# Patient Record
Sex: Male | Born: 1975 | Race: White | Hispanic: No | Smoking: Current every day smoker
Health system: Southern US, Community
[De-identification: ages and names within clinical notes are randomized; demographics above are authoritative.]

## PROBLEM LIST (undated history)

## (undated) DIAGNOSIS — Z720 Tobacco use: Secondary | ICD-10-CM

## (undated) DIAGNOSIS — J45909 Unspecified asthma, uncomplicated: Secondary | ICD-10-CM

## (undated) HISTORY — PX: TONSILLECTOMY: SUR1361

---

## 1998-07-19 ENCOUNTER — Emergency Department (HOSPITAL_COMMUNITY): Admission: EM | Admit: 1998-07-19 | Discharge: 1998-07-19 | Payer: Self-pay | Admitting: Emergency Medicine

## 1999-04-06 ENCOUNTER — Emergency Department (HOSPITAL_COMMUNITY): Admission: EM | Admit: 1999-04-06 | Discharge: 1999-04-07 | Payer: Self-pay | Admitting: Emergency Medicine

## 1999-04-23 ENCOUNTER — Emergency Department (HOSPITAL_COMMUNITY): Admission: EM | Admit: 1999-04-23 | Discharge: 1999-04-23 | Payer: Self-pay | Admitting: Emergency Medicine

## 1999-04-23 ENCOUNTER — Encounter: Payer: Self-pay | Admitting: Emergency Medicine

## 1999-05-02 ENCOUNTER — Emergency Department (HOSPITAL_COMMUNITY): Admission: EM | Admit: 1999-05-02 | Discharge: 1999-05-02 | Payer: Self-pay | Admitting: Emergency Medicine

## 1999-05-08 ENCOUNTER — Encounter: Admission: RE | Admit: 1999-05-08 | Discharge: 1999-05-08 | Payer: Self-pay | Admitting: Emergency Medicine

## 1999-05-12 ENCOUNTER — Emergency Department (HOSPITAL_COMMUNITY): Admission: EM | Admit: 1999-05-12 | Discharge: 1999-05-12 | Payer: Self-pay | Admitting: Emergency Medicine

## 1999-05-20 ENCOUNTER — Encounter: Admission: RE | Admit: 1999-05-20 | Discharge: 1999-05-20 | Payer: Self-pay | Admitting: Hematology and Oncology

## 1999-06-10 ENCOUNTER — Encounter: Admission: RE | Admit: 1999-06-10 | Discharge: 1999-06-10 | Payer: Self-pay | Admitting: Internal Medicine

## 1999-06-10 ENCOUNTER — Ambulatory Visit (HOSPITAL_COMMUNITY): Admission: RE | Admit: 1999-06-10 | Discharge: 1999-06-10 | Payer: Self-pay | Admitting: Internal Medicine

## 1999-06-16 ENCOUNTER — Emergency Department (HOSPITAL_COMMUNITY): Admission: EM | Admit: 1999-06-16 | Discharge: 1999-06-16 | Payer: Self-pay | Admitting: Emergency Medicine

## 1999-06-28 ENCOUNTER — Emergency Department (HOSPITAL_COMMUNITY): Admission: EM | Admit: 1999-06-28 | Discharge: 1999-06-28 | Payer: Self-pay | Admitting: Emergency Medicine

## 1999-08-17 ENCOUNTER — Emergency Department (HOSPITAL_COMMUNITY): Admission: EM | Admit: 1999-08-17 | Discharge: 1999-08-17 | Payer: Self-pay | Admitting: *Deleted

## 1999-08-26 ENCOUNTER — Encounter: Admission: RE | Admit: 1999-08-26 | Discharge: 1999-08-26 | Payer: Self-pay | Admitting: Internal Medicine

## 1999-08-29 ENCOUNTER — Encounter: Admission: RE | Admit: 1999-08-29 | Discharge: 1999-08-29 | Payer: Self-pay | Admitting: Hematology and Oncology

## 1999-11-19 ENCOUNTER — Encounter: Admission: RE | Admit: 1999-11-19 | Discharge: 1999-11-19 | Payer: Self-pay | Admitting: Hematology and Oncology

## 2000-02-13 ENCOUNTER — Encounter: Admission: RE | Admit: 2000-02-13 | Discharge: 2000-02-13 | Payer: Self-pay | Admitting: Hematology and Oncology

## 2000-04-24 ENCOUNTER — Encounter: Payer: Self-pay | Admitting: Emergency Medicine

## 2000-04-24 ENCOUNTER — Emergency Department (HOSPITAL_COMMUNITY): Admission: EM | Admit: 2000-04-24 | Discharge: 2000-04-24 | Payer: Self-pay | Admitting: Emergency Medicine

## 2000-04-29 ENCOUNTER — Encounter: Admission: RE | Admit: 2000-04-29 | Discharge: 2000-04-29 | Payer: Self-pay | Admitting: Internal Medicine

## 2000-06-16 ENCOUNTER — Encounter: Admission: RE | Admit: 2000-06-16 | Discharge: 2000-06-16 | Payer: Self-pay | Admitting: Hematology and Oncology

## 2000-07-15 ENCOUNTER — Encounter: Payer: Self-pay | Admitting: Emergency Medicine

## 2000-07-15 ENCOUNTER — Emergency Department (HOSPITAL_COMMUNITY): Admission: EM | Admit: 2000-07-15 | Discharge: 2000-07-15 | Payer: Self-pay | Admitting: Emergency Medicine

## 2000-07-20 ENCOUNTER — Encounter: Admission: RE | Admit: 2000-07-20 | Discharge: 2000-07-20 | Payer: Self-pay | Admitting: Internal Medicine

## 2000-08-27 ENCOUNTER — Encounter: Admission: RE | Admit: 2000-08-27 | Discharge: 2000-08-27 | Payer: Self-pay | Admitting: Hematology and Oncology

## 2000-11-23 ENCOUNTER — Emergency Department (HOSPITAL_COMMUNITY): Admission: EM | Admit: 2000-11-23 | Discharge: 2000-11-23 | Payer: Self-pay | Admitting: Emergency Medicine

## 2000-11-26 ENCOUNTER — Encounter: Admission: RE | Admit: 2000-11-26 | Discharge: 2000-11-26 | Payer: Self-pay | Admitting: Hematology and Oncology

## 2000-12-31 ENCOUNTER — Encounter: Payer: Self-pay | Admitting: Emergency Medicine

## 2000-12-31 ENCOUNTER — Emergency Department (HOSPITAL_COMMUNITY): Admission: EM | Admit: 2000-12-31 | Discharge: 2000-12-31 | Payer: Self-pay

## 2001-01-02 ENCOUNTER — Emergency Department (HOSPITAL_COMMUNITY): Admission: EM | Admit: 2001-01-02 | Discharge: 2001-01-02 | Payer: Self-pay | Admitting: Emergency Medicine

## 2001-01-12 ENCOUNTER — Emergency Department (HOSPITAL_COMMUNITY): Admission: EM | Admit: 2001-01-12 | Discharge: 2001-01-12 | Payer: Self-pay | Admitting: Emergency Medicine

## 2001-01-15 ENCOUNTER — Encounter: Admission: RE | Admit: 2001-01-15 | Discharge: 2001-01-15 | Payer: Self-pay | Admitting: Internal Medicine

## 2001-02-22 ENCOUNTER — Encounter: Admission: RE | Admit: 2001-02-22 | Discharge: 2001-02-22 | Payer: Self-pay | Admitting: *Deleted

## 2001-03-21 ENCOUNTER — Emergency Department (HOSPITAL_COMMUNITY): Admission: EM | Admit: 2001-03-21 | Discharge: 2001-03-21 | Payer: Self-pay | Admitting: *Deleted

## 2001-03-22 ENCOUNTER — Encounter: Payer: Self-pay | Admitting: *Deleted

## 2001-06-01 ENCOUNTER — Encounter: Admission: RE | Admit: 2001-06-01 | Discharge: 2001-06-01 | Payer: Self-pay

## 2001-06-01 ENCOUNTER — Ambulatory Visit (HOSPITAL_COMMUNITY): Admission: RE | Admit: 2001-06-01 | Discharge: 2001-06-01 | Payer: Self-pay

## 2001-06-21 ENCOUNTER — Emergency Department (HOSPITAL_COMMUNITY): Admission: EM | Admit: 2001-06-21 | Discharge: 2001-06-22 | Payer: Self-pay | Admitting: Emergency Medicine

## 2001-06-22 ENCOUNTER — Encounter: Payer: Self-pay | Admitting: Emergency Medicine

## 2001-06-25 ENCOUNTER — Encounter: Admission: RE | Admit: 2001-06-25 | Discharge: 2001-06-25 | Payer: Self-pay | Admitting: Internal Medicine

## 2001-07-01 ENCOUNTER — Ambulatory Visit (HOSPITAL_COMMUNITY): Admission: RE | Admit: 2001-07-01 | Discharge: 2001-07-01 | Payer: Self-pay | Admitting: Internal Medicine

## 2001-08-31 ENCOUNTER — Encounter: Admission: RE | Admit: 2001-08-31 | Discharge: 2001-08-31 | Payer: Self-pay | Admitting: *Deleted

## 2001-11-23 ENCOUNTER — Encounter: Admission: RE | Admit: 2001-11-23 | Discharge: 2001-11-23 | Payer: Self-pay

## 2001-11-26 ENCOUNTER — Encounter: Payer: Self-pay | Admitting: Emergency Medicine

## 2001-11-26 ENCOUNTER — Emergency Department (HOSPITAL_COMMUNITY): Admission: EM | Admit: 2001-11-26 | Discharge: 2001-11-26 | Payer: Self-pay | Admitting: Emergency Medicine

## 2002-04-12 ENCOUNTER — Encounter: Admission: RE | Admit: 2002-04-12 | Discharge: 2002-04-12 | Payer: Self-pay | Admitting: Internal Medicine

## 2002-11-01 ENCOUNTER — Encounter: Admission: RE | Admit: 2002-11-01 | Discharge: 2002-11-01 | Payer: Self-pay | Admitting: Internal Medicine

## 2003-01-06 ENCOUNTER — Emergency Department (HOSPITAL_COMMUNITY): Admission: EM | Admit: 2003-01-06 | Discharge: 2003-01-06 | Payer: Self-pay | Admitting: *Deleted

## 2003-01-30 ENCOUNTER — Emergency Department (HOSPITAL_COMMUNITY): Admission: EM | Admit: 2003-01-30 | Discharge: 2003-01-30 | Payer: Self-pay | Admitting: Emergency Medicine

## 2003-01-30 ENCOUNTER — Encounter: Payer: Self-pay | Admitting: Emergency Medicine

## 2003-06-07 ENCOUNTER — Emergency Department (HOSPITAL_COMMUNITY): Admission: EM | Admit: 2003-06-07 | Discharge: 2003-06-07 | Payer: Self-pay | Admitting: Emergency Medicine

## 2003-06-14 ENCOUNTER — Ambulatory Visit (HOSPITAL_COMMUNITY): Admission: RE | Admit: 2003-06-14 | Discharge: 2003-06-14 | Payer: Self-pay | Admitting: Internal Medicine

## 2003-06-14 ENCOUNTER — Encounter: Admission: RE | Admit: 2003-06-14 | Discharge: 2003-06-14 | Payer: Self-pay | Admitting: Internal Medicine

## 2003-07-19 ENCOUNTER — Encounter: Admission: RE | Admit: 2003-07-19 | Discharge: 2003-07-19 | Payer: Self-pay | Admitting: Internal Medicine

## 2004-04-11 ENCOUNTER — Emergency Department (HOSPITAL_COMMUNITY): Admission: EM | Admit: 2004-04-11 | Discharge: 2004-04-11 | Payer: Self-pay | Admitting: Family Medicine

## 2004-05-13 ENCOUNTER — Emergency Department (HOSPITAL_COMMUNITY): Admission: EM | Admit: 2004-05-13 | Discharge: 2004-05-14 | Payer: Self-pay | Admitting: Family Medicine

## 2004-05-24 ENCOUNTER — Ambulatory Visit: Payer: Self-pay | Admitting: Internal Medicine

## 2004-05-31 ENCOUNTER — Ambulatory Visit: Payer: Self-pay | Admitting: Internal Medicine

## 2004-12-21 ENCOUNTER — Emergency Department (HOSPITAL_COMMUNITY): Admission: EM | Admit: 2004-12-21 | Discharge: 2004-12-21 | Payer: Self-pay | Admitting: Emergency Medicine

## 2005-04-28 ENCOUNTER — Emergency Department: Payer: Self-pay | Admitting: Emergency Medicine

## 2005-05-01 ENCOUNTER — Emergency Department (HOSPITAL_COMMUNITY): Admission: EM | Admit: 2005-05-01 | Discharge: 2005-05-01 | Payer: Self-pay | Admitting: Emergency Medicine

## 2005-05-01 ENCOUNTER — Ambulatory Visit (HOSPITAL_COMMUNITY): Admission: RE | Admit: 2005-05-01 | Discharge: 2005-05-01 | Payer: Self-pay | Admitting: Emergency Medicine

## 2005-06-30 ENCOUNTER — Ambulatory Visit: Payer: Self-pay | Admitting: Internal Medicine

## 2005-07-21 ENCOUNTER — Emergency Department (HOSPITAL_COMMUNITY): Admission: EM | Admit: 2005-07-21 | Discharge: 2005-07-21 | Payer: Self-pay | Admitting: Emergency Medicine

## 2005-07-22 ENCOUNTER — Emergency Department (HOSPITAL_COMMUNITY): Admission: EM | Admit: 2005-07-22 | Discharge: 2005-07-22 | Payer: Self-pay | Admitting: Emergency Medicine

## 2005-08-20 ENCOUNTER — Emergency Department (HOSPITAL_COMMUNITY): Admission: EM | Admit: 2005-08-20 | Discharge: 2005-08-20 | Payer: Self-pay | Admitting: Family Medicine

## 2005-09-29 ENCOUNTER — Ambulatory Visit: Payer: Self-pay | Admitting: Internal Medicine

## 2005-10-25 ENCOUNTER — Emergency Department (HOSPITAL_COMMUNITY): Admission: EM | Admit: 2005-10-25 | Discharge: 2005-10-25 | Payer: Self-pay | Admitting: Family Medicine

## 2005-12-05 ENCOUNTER — Ambulatory Visit: Payer: Self-pay | Admitting: Internal Medicine

## 2005-12-05 ENCOUNTER — Ambulatory Visit (HOSPITAL_COMMUNITY): Admission: RE | Admit: 2005-12-05 | Discharge: 2005-12-05 | Payer: Self-pay | Admitting: Internal Medicine

## 2006-06-22 DIAGNOSIS — F410 Panic disorder [episodic paroxysmal anxiety] without agoraphobia: Secondary | ICD-10-CM

## 2006-06-22 DIAGNOSIS — J449 Chronic obstructive pulmonary disease, unspecified: Secondary | ICD-10-CM

## 2006-06-22 DIAGNOSIS — J4489 Other specified chronic obstructive pulmonary disease: Secondary | ICD-10-CM | POA: Insufficient documentation

## 2006-06-22 DIAGNOSIS — F411 Generalized anxiety disorder: Secondary | ICD-10-CM | POA: Insufficient documentation

## 2006-06-22 DIAGNOSIS — F17200 Nicotine dependence, unspecified, uncomplicated: Secondary | ICD-10-CM | POA: Insufficient documentation

## 2006-08-20 DIAGNOSIS — F1320 Sedative, hypnotic or anxiolytic dependence, uncomplicated: Secondary | ICD-10-CM | POA: Insufficient documentation

## 2006-09-15 ENCOUNTER — Telehealth (INDEPENDENT_AMBULATORY_CARE_PROVIDER_SITE_OTHER): Payer: Self-pay | Admitting: *Deleted

## 2007-01-15 ENCOUNTER — Emergency Department (HOSPITAL_COMMUNITY): Admission: EM | Admit: 2007-01-15 | Discharge: 2007-01-15 | Payer: Self-pay | Admitting: Emergency Medicine

## 2007-01-25 ENCOUNTER — Telehealth (INDEPENDENT_AMBULATORY_CARE_PROVIDER_SITE_OTHER): Payer: Self-pay | Admitting: *Deleted

## 2007-01-27 ENCOUNTER — Ambulatory Visit: Payer: Self-pay | Admitting: Internal Medicine

## 2007-01-27 DIAGNOSIS — G459 Transient cerebral ischemic attack, unspecified: Secondary | ICD-10-CM | POA: Insufficient documentation

## 2007-01-28 ENCOUNTER — Encounter (INDEPENDENT_AMBULATORY_CARE_PROVIDER_SITE_OTHER): Payer: Self-pay | Admitting: Internal Medicine

## 2007-01-28 LAB — CONVERTED CEMR LAB
ALT: 10 units/L (ref 0–53)
CO2: 25 meq/L (ref 19–32)
Chloride: 106 meq/L (ref 96–112)
Cholesterol: 122 mg/dL (ref 0–200)
Creatinine, Ser: 1.07 mg/dL (ref 0.40–1.50)
Glucose, Bld: 93 mg/dL (ref 70–99)
HCT: 45.9 % (ref 39.0–52.0)
HDL: 43 mg/dL (ref >39)
LDL Cholesterol: 63 mg/dL (ref 0–99)
Total Bilirubin: 0.5 mg/dL (ref 0.3–1.2)
Total CHOL/HDL Ratio: 2.8
Total Protein: 7.1 g/dL (ref 6.0–8.3)

## 2007-01-29 ENCOUNTER — Telehealth (INDEPENDENT_AMBULATORY_CARE_PROVIDER_SITE_OTHER): Payer: Self-pay | Admitting: *Deleted

## 2007-05-31 ENCOUNTER — Telehealth: Payer: Self-pay | Admitting: *Deleted

## 2007-06-01 ENCOUNTER — Emergency Department (HOSPITAL_COMMUNITY): Admission: EM | Admit: 2007-06-01 | Discharge: 2007-06-01 | Payer: Self-pay | Admitting: Emergency Medicine

## 2007-07-10 ENCOUNTER — Emergency Department (HOSPITAL_COMMUNITY): Admission: EM | Admit: 2007-07-10 | Discharge: 2007-07-10 | Payer: Self-pay | Admitting: Emergency Medicine

## 2007-07-15 ENCOUNTER — Ambulatory Visit: Payer: Self-pay | Admitting: Internal Medicine

## 2007-07-15 ENCOUNTER — Ambulatory Visit (HOSPITAL_COMMUNITY): Admission: RE | Admit: 2007-07-15 | Discharge: 2007-07-15 | Payer: Self-pay | Admitting: Internal Medicine

## 2007-07-15 ENCOUNTER — Encounter (INDEPENDENT_AMBULATORY_CARE_PROVIDER_SITE_OTHER): Payer: Self-pay | Admitting: *Deleted

## 2007-07-15 DIAGNOSIS — S99929A Unspecified injury of unspecified foot, initial encounter: Secondary | ICD-10-CM

## 2007-07-15 DIAGNOSIS — S99919A Unspecified injury of unspecified ankle, initial encounter: Secondary | ICD-10-CM

## 2007-07-15 DIAGNOSIS — S8990XA Unspecified injury of unspecified lower leg, initial encounter: Secondary | ICD-10-CM | POA: Insufficient documentation

## 2007-07-15 DIAGNOSIS — F528 Other sexual dysfunction not due to a substance or known physiological condition: Secondary | ICD-10-CM

## 2007-07-15 IMAGING — CR DG ANKLE COMPLETE 3+V*R*
3 series · 3 of 3 positions shown · non-contrast
Comparison: none

CLINICAL DATA: Injury with foot and ankle pain.
 RIGHT FOOT ? 3 VIEW:

[t ankle joint ap right]
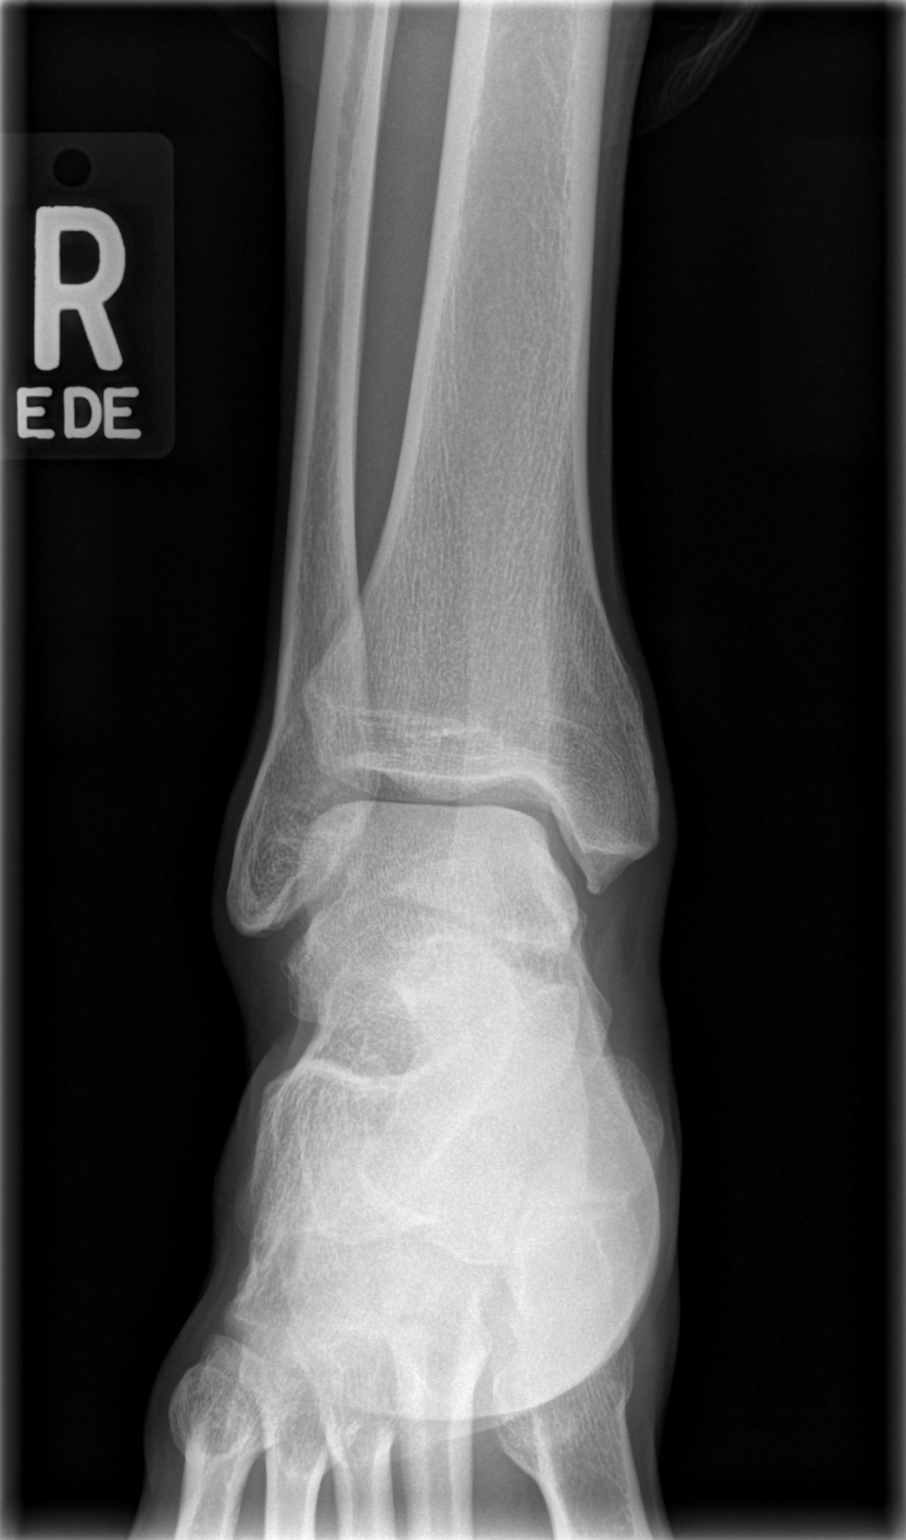

[t ankle joint oblique right]
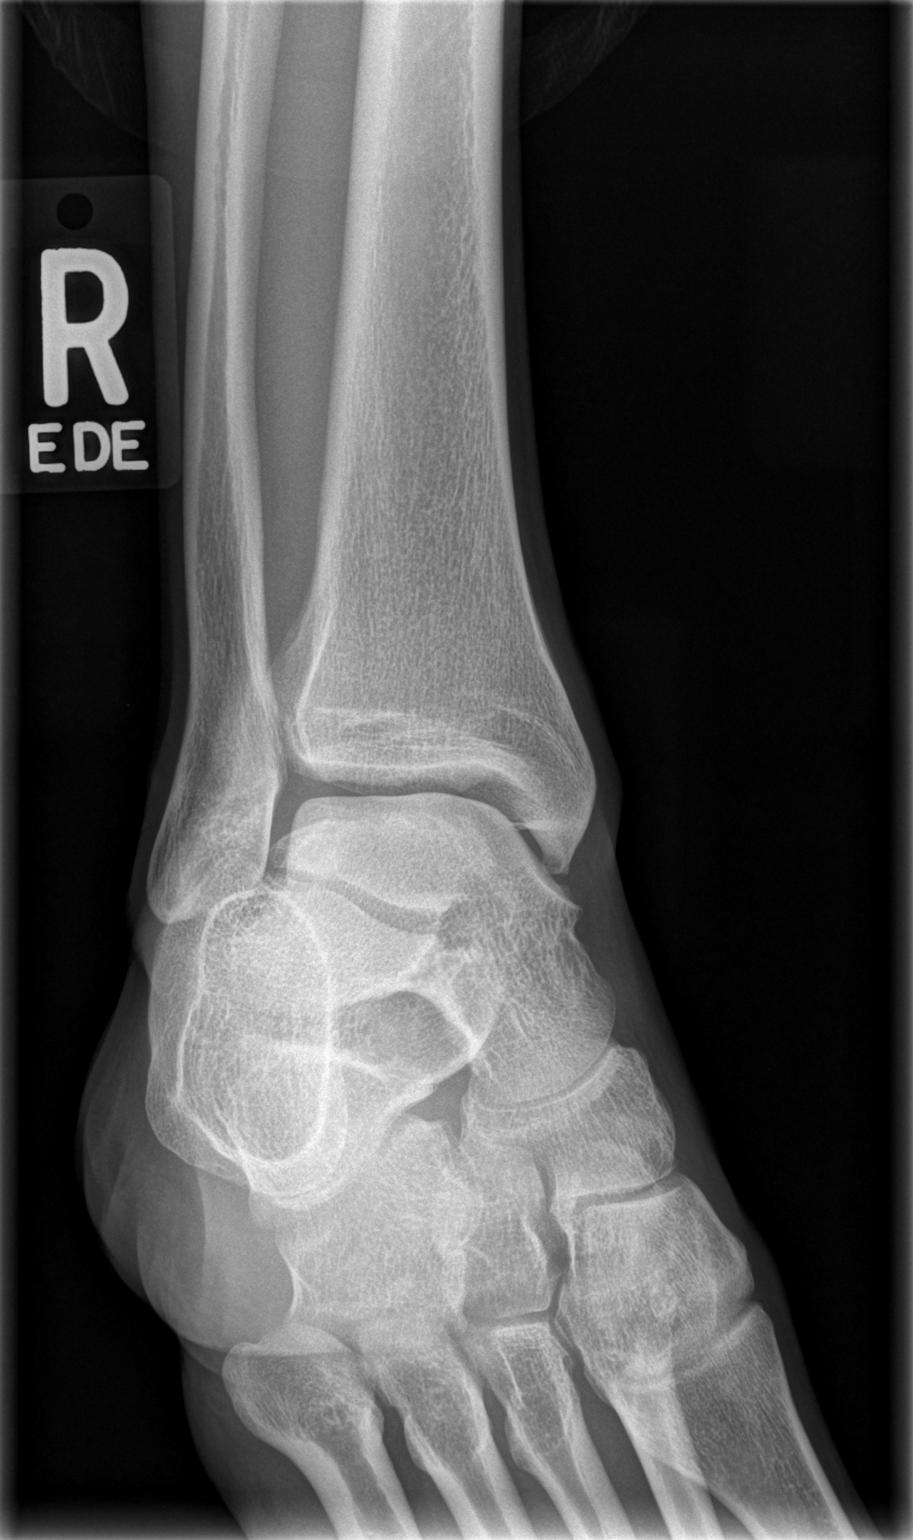

[t ankle joint lat right]
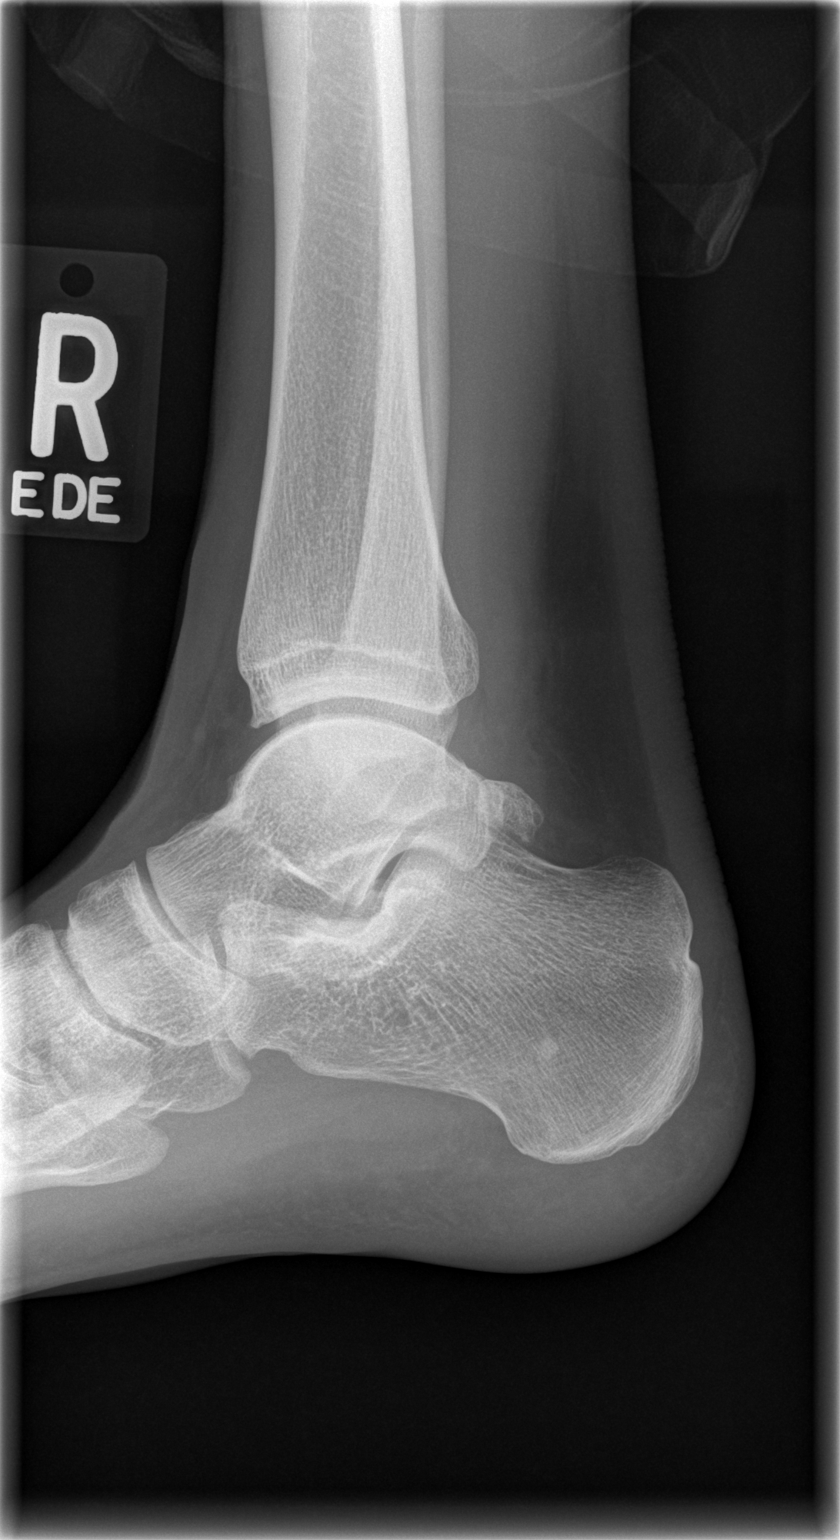

[3 of 3 positions shown; findings below may reference images not displayed]

FINDINGS: No evidence of fracture or dislocation.
IMPRESSION: Negative.
 RIGHT ANKLE ? 3 VIEW:
FINDINGS: No definite acute finding.  There could be a tiny avulsion laterally related to the calcaneofibular ligament, but this is debatable.
IMPRESSION: As discussed above.

## 2007-07-18 ENCOUNTER — Telehealth (INDEPENDENT_AMBULATORY_CARE_PROVIDER_SITE_OTHER): Payer: Self-pay | Admitting: *Deleted

## 2007-07-21 ENCOUNTER — Telehealth (INDEPENDENT_AMBULATORY_CARE_PROVIDER_SITE_OTHER): Payer: Self-pay | Admitting: *Deleted

## 2007-07-22 ENCOUNTER — Telehealth (INDEPENDENT_AMBULATORY_CARE_PROVIDER_SITE_OTHER): Payer: Self-pay | Admitting: *Deleted

## 2007-08-10 ENCOUNTER — Telehealth (INDEPENDENT_AMBULATORY_CARE_PROVIDER_SITE_OTHER): Payer: Self-pay | Admitting: Internal Medicine

## 2007-09-30 ENCOUNTER — Telehealth: Payer: Self-pay | Admitting: *Deleted

## 2007-10-08 ENCOUNTER — Emergency Department (HOSPITAL_COMMUNITY): Admission: EM | Admit: 2007-10-08 | Discharge: 2007-10-08 | Payer: Self-pay | Admitting: Emergency Medicine

## 2007-11-05 ENCOUNTER — Ambulatory Visit: Payer: Self-pay | Admitting: Hospitalist

## 2007-11-05 DIAGNOSIS — M25559 Pain in unspecified hip: Secondary | ICD-10-CM | POA: Insufficient documentation

## 2007-11-10 ENCOUNTER — Encounter (INDEPENDENT_AMBULATORY_CARE_PROVIDER_SITE_OTHER): Payer: Self-pay | Admitting: Internal Medicine

## 2007-11-10 ENCOUNTER — Telehealth (INDEPENDENT_AMBULATORY_CARE_PROVIDER_SITE_OTHER): Payer: Self-pay | Admitting: *Deleted

## 2007-11-11 ENCOUNTER — Telehealth (INDEPENDENT_AMBULATORY_CARE_PROVIDER_SITE_OTHER): Payer: Self-pay | Admitting: Internal Medicine

## 2007-11-16 ENCOUNTER — Telehealth: Payer: Self-pay | Admitting: Internal Medicine

## 2007-11-17 ENCOUNTER — Ambulatory Visit: Payer: Self-pay | Admitting: Infectious Disease

## 2007-11-17 DIAGNOSIS — K029 Dental caries, unspecified: Secondary | ICD-10-CM

## 2007-11-30 ENCOUNTER — Telehealth (INDEPENDENT_AMBULATORY_CARE_PROVIDER_SITE_OTHER): Payer: Self-pay | Admitting: Internal Medicine

## 2007-12-27 ENCOUNTER — Telehealth: Payer: Self-pay | Admitting: *Deleted

## 2008-02-02 ENCOUNTER — Telehealth (INDEPENDENT_AMBULATORY_CARE_PROVIDER_SITE_OTHER): Payer: Self-pay | Admitting: Internal Medicine

## 2011-05-02 LAB — CBC
HCT: 44.2
Hemoglobin: 15.3
RBC: 4.65
WBC: 7.8

## 2011-05-02 LAB — BASIC METABOLIC PANEL
BUN: 5 — ABNORMAL LOW
Calcium: 9
GFR calc non Af Amer: >60

## 2011-05-02 LAB — RAPID URINE DRUG SCREEN, HOSP PERFORMED: Benzodiazepines: NOT DETECTED

## 2011-05-02 LAB — DIFFERENTIAL
Basophils Absolute: 0
Basophils Relative: 1
Eosinophils Relative: 5
Lymphs Abs: 2.8
Monocytes Absolute: 0.6

## 2011-05-02 LAB — ETHANOL: Alcohol, Ethyl (B): 6

## 2016-12-03 ENCOUNTER — Emergency Department: Payer: Self-pay

## 2016-12-03 ENCOUNTER — Encounter: Payer: Self-pay | Admitting: Emergency Medicine

## 2016-12-03 ENCOUNTER — Observation Stay
Admission: EM | Admit: 2016-12-03 | Discharge: 2016-12-04 | Disposition: A | Discharge status: Home / Self Care | Payer: Self-pay | Attending: Internal Medicine | Admitting: Internal Medicine

## 2016-12-03 DIAGNOSIS — R0789 Other chest pain: Principal | ICD-10-CM | POA: Insufficient documentation

## 2016-12-03 DIAGNOSIS — F17210 Nicotine dependence, cigarettes, uncomplicated: Secondary | ICD-10-CM | POA: Insufficient documentation

## 2016-12-03 DIAGNOSIS — Z8673 Personal history of transient ischemic attack (TIA), and cerebral infarction without residual deficits: Secondary | ICD-10-CM | POA: Insufficient documentation

## 2016-12-03 DIAGNOSIS — Z7901 Long term (current) use of anticoagulants: Secondary | ICD-10-CM | POA: Insufficient documentation

## 2016-12-03 DIAGNOSIS — I951 Orthostatic hypotension: Secondary | ICD-10-CM | POA: Insufficient documentation

## 2016-12-03 DIAGNOSIS — R000 Tachycardia, unspecified: Secondary | ICD-10-CM | POA: Insufficient documentation

## 2016-12-03 DIAGNOSIS — F410 Panic disorder [episodic paroxysmal anxiety] without agoraphobia: Secondary | ICD-10-CM | POA: Insufficient documentation

## 2016-12-03 DIAGNOSIS — K0261 Dental caries on smooth surface limited to enamel: Secondary | ICD-10-CM | POA: Insufficient documentation

## 2016-12-03 DIAGNOSIS — N289 Disorder of kidney and ureter, unspecified: Secondary | ICD-10-CM | POA: Insufficient documentation

## 2016-12-03 DIAGNOSIS — R42 Dizziness and giddiness: Secondary | ICD-10-CM

## 2016-12-03 DIAGNOSIS — J449 Chronic obstructive pulmonary disease, unspecified: Secondary | ICD-10-CM | POA: Insufficient documentation

## 2016-12-03 DIAGNOSIS — R079 Chest pain, unspecified: Secondary | ICD-10-CM | POA: Diagnosis present

## 2016-12-03 DIAGNOSIS — R0602 Shortness of breath: Secondary | ICD-10-CM

## 2016-12-03 HISTORY — DX: Tobacco use: Z72.0

## 2016-12-03 LAB — URINALYSIS, COMPLETE (UACMP) WITH MICROSCOPIC
BACTERIA UA: NONE SEEN
Bilirubin Urine: NEGATIVE
Glucose, UA: NEGATIVE mg/dL
Hgb urine dipstick: NEGATIVE
Ketones, ur: NEGATIVE mg/dL
LEUKOCYTES UA: NEGATIVE
Nitrite: NEGATIVE
PH: 6 (ref 5.0–8.0)
Protein, ur: NEGATIVE mg/dL
RBC / HPF: NONE SEEN RBC/hpf (ref 0–5)
SPECIFIC GRAVITY, URINE: 1.005 (ref 1.005–1.030)
SQUAMOUS EPITHELIAL / LPF: NONE SEEN

## 2016-12-03 LAB — BASIC METABOLIC PANEL
Anion gap: 10 (ref 5–15)
BUN: 12 mg/dL (ref 6–20)
CO2: 23 mmol/L (ref 22–32)
Calcium: 9.3 mg/dL (ref 8.9–10.3)
Chloride: 101 mmol/L (ref 101–111)
Creatinine, Ser: 1.29 mg/dL — ABNORMAL HIGH (ref 0.61–1.24)
GFR calc Af Amer: >60 mL/min (ref >60)
Glucose, Bld: 135 mg/dL — ABNORMAL HIGH (ref 65–99)
Potassium: 3.7 mmol/L (ref 3.5–5.1)
Sodium: 134 mmol/L — ABNORMAL LOW (ref 135–145)

## 2016-12-03 LAB — TROPONIN I: Troponin I: <0.03 ng/mL (ref <0.03)

## 2016-12-03 LAB — CBC
HEMATOCRIT: 46.5 % (ref 40.0–52.0)
HEMOGLOBIN: 15.7 g/dL (ref 13.0–18.0)
MCH: 32.2 pg (ref 26.0–34.0)
MCHC: 33.9 g/dL (ref 32.0–36.0)
MCV: 95.1 fL (ref 80.0–100.0)
Platelets: 254 10*3/uL (ref 150–440)
RBC: 4.89 MIL/uL (ref 4.40–5.90)
RDW: 13.5 % (ref 11.5–14.5)
WBC: 9.4 10*3/uL (ref 3.8–10.6)

## 2016-12-03 MED ORDER — ONDANSETRON HCL 4 MG/2ML IJ SOLN: 4.0000 mg | Freq: Four times a day (QID) | INTRAMUSCULAR | Status: DC | PRN

## 2016-12-03 MED ORDER — ACETAMINOPHEN 325 MG PO TABS
650.0000 mg | ORAL_TABLET | Freq: Four times a day (QID) | ORAL | Status: DC | PRN
Start: 2016-12-03 — End: 2016-12-04
  Administered 2016-12-04: 650 mg via ORAL
  Filled 2016-12-03: qty 2

## 2016-12-03 MED ORDER — ASPIRIN EC 81 MG PO TBEC
81.0000 mg | DELAYED_RELEASE_TABLET | Freq: Every day | ORAL | Status: DC
  Administered 2016-12-04: 81 mg via ORAL
  Filled 2016-12-03: qty 1

## 2016-12-03 MED ORDER — ENOXAPARIN SODIUM 40 MG/0.4ML ~~LOC~~ SOLN
40.0000 mg | SUBCUTANEOUS | Status: DC
  Administered 2016-12-03: 40 mg via SUBCUTANEOUS
  Filled 2016-12-03: qty 0.4

## 2016-12-03 MED ORDER — SODIUM CHLORIDE 0.9 % IV BOLUS (SEPSIS)
1000.0000 mL | Freq: Once | INTRAVENOUS | Status: AC
  Administered 2016-12-03: 1000 mL via INTRAVENOUS

## 2016-12-03 MED ORDER — NITROGLYCERIN 0.4 MG SL SUBL: 0.4000 mg | SUBLINGUAL_TABLET | SUBLINGUAL | Status: DC | PRN

## 2016-12-03 MED ORDER — ASPIRIN 81 MG PO CHEW
324.0000 mg | CHEWABLE_TABLET | Freq: Once | ORAL | Status: AC
  Administered 2016-12-03: 324 mg via ORAL
  Filled 2016-12-03: qty 4

## 2016-12-03 MED ORDER — NITROGLYCERIN 0.4 MG SL SUBL
0.4000 mg | SUBLINGUAL_TABLET | SUBLINGUAL | Status: DC | PRN
  Administered 2016-12-03 (×3): 0.4 mg via SUBLINGUAL
  Filled 2016-12-03: qty 1

## 2016-12-03 MED ORDER — ACETAMINOPHEN 650 MG RE SUPP: 650.0000 mg | Freq: Four times a day (QID) | RECTAL | Status: DC | PRN

## 2016-12-03 MED ORDER — NICOTINE 21 MG/24HR TD PT24
21.0000 mg | MEDICATED_PATCH | Freq: Every day | TRANSDERMAL | Status: DC
  Administered 2016-12-03 – 2016-12-04 (×2): 21 mg via TRANSDERMAL
  Filled 2016-12-03 (×2): qty 1

## 2016-12-03 MED ORDER — ONDANSETRON HCL 4 MG PO TABS: 4.0000 mg | ORAL_TABLET | Freq: Four times a day (QID) | ORAL | Status: DC | PRN

## 2016-12-03 MED ORDER — MORPHINE SULFATE (PF) 4 MG/ML IV SOLN: 2.0000 mg | INTRAVENOUS | Status: DC | PRN

## 2016-12-03 MED ORDER — SODIUM CHLORIDE 0.9% FLUSH
3.0000 mL | Freq: Two times a day (BID) | INTRAVENOUS | Status: DC
  Administered 2016-12-03 – 2016-12-04 (×2): 3 mL via INTRAVENOUS

## 2016-12-03 NOTE — ED Notes (Signed)
Dr. Sainani at bedside at this time.  

## 2016-12-03 NOTE — ED Triage Notes (Signed)
Pt presents from home with chest pain. States he awakened at 0400 this morning and went to the bathroom feeling dizzy and having chest pain. He was going to call 911 and must have passed out or gone back to sleep and awakened at 0900, sweaty. Pt began to have the chest pain and dizziness again this afternoon. Pt alert & oriented with NAD noted.

## 2016-12-03 NOTE — H&P (Signed)
[facility] - [place] at [facility]   PATIENT NAME: Antonio Figueroa    MR#:  [id number]  DATE OF BIRTH:  Dec 13, 1975  DATE OF ADMISSION:  12/03/2016  PRIMARY CARE PHYSICIAN: No PCP Per Patient   REQUESTING/REFERRING PHYSICIAN: Dr. Virgilio Frees  CHIEF COMPLAINT:   Chief Complaint  Patient presents with  . Chest Pain  . Dizziness    HISTORY OF PRESENT ILLNESS:  Antonio Figueroa  is a 41 y.o. male with a known history of Tobacco abuse who presented to the hospital due to chest pain. Patient says he developed some chest pain yesterday while walking in his backyard. He describes the pain as pressure-like sensation in center of  his chest sometimes radiating to his left arm and also intermittently of his neck. It was associated with some nausea and some diaphoresis but no palpitations or syncope. Patient says that he developed some chest pain in the middle of night again when attempted to go to the bathroom and again this afternoon and therefore came to ER for further evaluation. Patient does have a significant family history of stroke and heart disease and therefore hospitalist services were contacted further treatment and evaluation. Patient was given some nitroglycerin here in the ER and his chest pain is improved and resolved now.  PAST MEDICAL HISTORY:   Past Medical History:  Diagnosis Date  . Tobacco abuse     PAST SURGICAL HISTORY:   Past Surgical History:  Procedure Laterality Date  . TONSILLECTOMY      SOCIAL HISTORY:   Social History  Substance Use Topics  . Smoking status: Current Every Day Smoker    Packs/day: 1.00    Years: 30.00    Types: Cigarettes  . Smokeless tobacco: Never Used  . Alcohol use No    FAMILY HISTORY:   Family History  Problem Relation Age of Onset  . Diabetes Father   . CVA Father   . Heart disease Paternal Grandmother     DRUG ALLERGIES:  No Known Allergies  REVIEW OF SYSTEMS:   Review of Systems  Constitutional:  Negative for fever and weight loss.  HENT: Negative for congestion, nosebleeds and tinnitus.   Eyes: Negative for blurred vision, double vision and redness.  Respiratory: Negative for cough, hemoptysis and shortness of breath.   Cardiovascular: Positive for chest pain. Negative for orthopnea, leg swelling and PND.  Gastrointestinal: Negative for abdominal pain, diarrhea, melena, nausea and vomiting.  Genitourinary: Negative for dysuria, hematuria and urgency.  Musculoskeletal: Negative for falls and joint pain.  Neurological: Negative for dizziness, tingling, sensory change, focal weakness, seizures, weakness and headaches.  Endo/Heme/Allergies: Negative for polydipsia. Does not bruise/bleed easily.  Psychiatric/Behavioral: Negative for depression and memory loss. The patient is not nervous/anxious.     MEDICATIONS AT HOME:   Prior to Admission medications   Not on File      VITAL SIGNS:  Blood pressure 115/88, pulse (!) 102, temperature 98.5 F (36.9 C), temperature source Oral, resp. rate (!) 26, height  (1.93 m), weight 68 kg (150 lb), SpO2 99 %.  PHYSICAL EXAMINATION:  Physical Exam  GENERAL:  41 y.o.-year-old patient lying in bed in no acute distress.  EYES: Pupils equal, round, reactive to light and accommodation. No scleral icterus. Extraocular muscles intact.  HEENT: Head atraumatic, normocephalic. Oropharynx and nasopharynx clear. No oropharyngeal erythema, moist oral mucosa  NECK:  Supple, no jugular venous distention. No thyroid enlargement, no tenderness.  LUNGS: Normal breath sounds bilaterally, no wheezing, rales,  rhonchi. No use of accessory muscles of respiration.  CARDIOVASCULAR: S1, S2 RRR. No murmurs, rubs, gallops, clicks.  ABDOMEN: Soft, nontender, nondistended. Bowel sounds present. No organomegaly or mass.  EXTREMITIES: No pedal edema, cyanosis, or clubbing. + 2 pedal & radial pulses b/l.   NEUROLOGIC: Cranial nerves II through XII are intact. No focal  Motor or sensory deficits appreciated b/l PSYCHIATRIC: The patient is alert and oriented x 3.  SKIN: No obvious rash, lesion, or ulcer.   LABORATORY PANEL:   CBC  Recent Labs Lab 12/03/16 1532  WBC 9.4  HGB 15.7  HCT 46.5  PLT 254   ------------------------------------------------------------------------------------------------------------------  Chemistries   Recent Labs Lab 12/03/16 1532  NA 134*  K 3.7  CL 101  CO2 23  GLUCOSE 135*  BUN 12  CREATININE 1.29*  CALCIUM 9.3   ------------------------------------------------------------------------------------------------------------------  Cardiac Enzymes  Recent Labs Lab 12/03/16 1532  TROPONINI <0.03   ------------------------------------------------------------------------------------------------------------------  RADIOLOGY:  Dg Chest 2 View  Result Date: 12/03/2016 CLINICAL DATA:  Chest pain. EXAM: CHEST  2 VIEW COMPARISON:  Radiographs of August 20, 2005. FINDINGS: The heart size and mediastinal contours are within normal limits. Both lungs are clear. No pneumothorax or pleural effusion is noted. Hyperexpansion of the lungs is noted suggesting chronic obstructive pulmonary disease. The visualized skeletal structures are unremarkable. IMPRESSION: No active cardiopulmonary disease. Findings consistent with chronic obstructive pulmonary disease. Electronically Signed   By: Lupita Raider, M.D.   On: 12/03/2016 16:47     IMPRESSION AND PLAN:   41 year old male with past medical history of tobacco abuse who presents to the hospital due to chest pain.  1. Chest pain-patient does have risk factors given his family history and ongoing tobacco abuse. -We will observe on telemetry, cycle his cardiac markers. His first set of markers are negative, his EKG shows no acute ST or T-wave changes. -Place on aspirin, sublingual Nitrostat as needed, check a lipid profile. If cardiac markers turn positive with consult  cardiology. -I will plan for a nuclear medicine stress test for tomorrow.    All the records are reviewed and case discussed with ED provider. Management plans discussed with the patient, family and they are in agreement.  CODE STATUS: Full  TOTAL TIME TAKING CARE OF THIS PATIENT: 40 minutes.    Houston Siren M.D on 12/03/2016 at 5:51 PM  Between 7am to 6pm - Pager - [phone]  After 6pm go to [email or web address] - password [email or web address] [facility]  [facility]  Office  [phone]  CC: Primary care physician; No PCP Per Patient

## 2016-12-03 NOTE — Progress Notes (Signed)
   12/03/16 2000  Clinical Encounter Type  Visited With Patient  Visit Type Initial;Follow-up;Psychological support;Spiritual support;Social support  Referral From Nurse  Spiritual Encounters  Spiritual Needs Prayer;Emotional  CH responded to spiritual consult for patient request for prayer; Patient observed to be positive; family was leaving as CH arrived; [facility] offered spiritual, social and emotional support along with prayer; Ch available as needed and will refer to day CH to follow-up. Erline Levine 8:37 PM

## 2016-12-03 NOTE — ED Notes (Signed)
This RN to bedside at this time. Pt sitting in bed drinking coffee. NAD noted, respirations even and unlabored.

## 2016-12-03 NOTE — ED Provider Notes (Addendum)
[facility] Emergency Department Provider Note  ____________________________________________  Time seen: Approximately 4:33 PM  I have reviewed the triage vital signs and the nursing notes.   HISTORY  Chief Complaint Chest Pain and Dizziness    HPI Antonio Figueroa is a 41 y.o. male with a history of ongoing tobacco abusepresenting with chest pain. The patient reports that he was walking yesterday when he developed a pressure sensation across his entire chest which radiated down the left arm and was associated with shortness of breath and a sweaty feeling. He did also become lightheaded and states he felt slightly confused. This lasted for several minutes and resolved when he rested. At 4 AM, he walked to the bathroom and had a similar episode. At 2 PM, again he was exerting himself and had another similar episode but this time the chest pain continued. His chest pain is not related to position or food. It is better with rest. At this time, the patient states that he has a very mild chest pain, but overall is feeling better.  The patient has never had a stress test or cardiac catheterization.  The patient denies cocaine. He does have a positive family history for a maternal grandmother with MI at age 8.   History reviewed. No pertinent past medical history.  Patient Active Problem List   Diagnosis Date Noted  . DENTAL CARIES LIMITED TO ENAMEL 11/17/2007  . THIGH, PAIN 11/05/2007  . ERECTILE DYSFUNCTION 07/15/2007  . ANKLE INJURY, RIGHT 07/15/2007  . TIA 01/27/2007  . 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 ANXIETY DISORDER 06/22/2006  . PANIC DISORDER 06/22/2006  . TOBACCO ABUSE 06/22/2006  . 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 TONSILLECTOMY        Allergies Patient has no known allergies.  History reviewed. No pertinent family history.  Social History Social History  Substance Use Topics  . Smoking  status: Current Every Day Smoker    Packs/day: 1.00    Types: Cigarettes  . Smokeless tobacco: Never Used  . Alcohol use No    Review of Systems Constitutional: No fever/chills.Positive lightheadedness without syncope. Positive diaphoresis. Eyes: No visual changes. Blurred or double vision. ENT: No sore throat. No congestion or rhinorrhea. Cardiovascular: Positive chest pain. Denies palpitations. Respiratory: Positive shortness of breath.  No cough. Gastrointestinal: No abdominal pain.  No nausea, no vomiting.  No diarrhea.  No constipation. Genitourinary: Negative for dysuria. Musculoskeletal: Negative for back pain. Skin: Negative for rash. Neurological: Negative for headaches. No focal numbness, tingling or weakness.   10-point ROS otherwise negative.  ____________________________________________   PHYSICAL EXAM:  VITAL SIGNS: ED Triage Vitals  Enc Vitals Group     BP 12/03/16 1530 134/85     Pulse Rate 12/03/16 1530 (!) 120     Resp 12/03/16 1530 18     Temp 12/03/16 1530 98.5 F (36.9 C)     Temp Source 12/03/16 1530 Oral     SpO2 12/03/16 1530 99 %     Weight 12/03/16 1531 150 lb (68 kg)     Height 12/03/16 1531  (1.93 m)     Head Circumference --      Peak Flow --      Pain Score 12/03/16 1530 4     Pain Loc --      Pain Edu? --      Excl. in GC? --     Constitutional: Alert and oriented. Well appearing  and in no acute distress. Answers questions appropriately. Eyes: Conjunctivae are normal.  EOMI. No scleral icterus. Head: Atraumatic. Nose: No congestion/rhinnorhea. Mouth/Throat: Mucous membranes are moist.  Neck: No stridor.  Supple.  No JVD. Cardiovascular: Normal rate, regular rhythm while laying down. When the patient sits up, his heart rate jumps from 94-108.Marland Kitchen No murmurs, rubs or gallops.  Respiratory: Normal respiratory effort.  No accessory muscle use or retractions. Lungs CTAB.  No wheezes, rales or ronchi. Gastrointestinal: Soft, nontender  and nondistended.  No guarding or rebound.  No peritoneal signs. Musculoskeletal: No LE edema. No ttp in the calves or palpable cords.  Negative Homan's sign. Neurologic:  A&Ox3.  Speech is clear.  Face and smile are symmetric.  EOMI.  Moves all extremities well. Skin:  Skin is warm, dry and intact. No rash noted. Psychiatric: Mood and affect are normal. Speech and behavior are normal.  Normal judgement.  ____________________________________________   LABS (all labs ordered are listed, but only abnormal results are displayed)  Labs Reviewed  BASIC METABOLIC PANEL - Abnormal; Notable for the following:       Result Value   Sodium 134 (*)    Glucose, Bld 135 (*)    Creatinine, Ser 1.29 (*)    All other components within normal limits  CBC  TROPONIN I  URINALYSIS, COMPLETE (UACMP) WITH MICROSCOPIC   ____________________________________________  EKG  ED ECG REPORT I, Rockne Menghini, the attending physician, personally viewed and interpreted this ECG.   Date: 12/03/2016  EKG Time: 1535  Rate: 117  Rhythm: sinus tachycardia  Axis: normal  Intervals:none  ST&T Change: No STEMI   Repeat EKG: ED ECG REPORT I, Rockne Menghini, the attending physician, personally viewed and interpreted this ECG.   Date: 12/03/2016  EKG Time: 1624  Rate: 94  Rhythm: normal sinus rhythm  Axis: normal  Intervals:none  ST&T Change: No STEMI  ____________________________________________  RADIOLOGY  Dg Chest 2 View  Result Date: 12/03/2016 CLINICAL DATA:  Chest pain. EXAM: CHEST  2 VIEW COMPARISON:  Radiographs of August 20, 2005. FINDINGS: The heart size and mediastinal contours are within normal limits. Both lungs are clear. No pneumothorax or pleural effusion is noted. Hyperexpansion of the lungs is noted suggesting chronic obstructive pulmonary disease. The visualized skeletal structures are unremarkable. IMPRESSION: No active cardiopulmonary disease. Findings consistent with  chronic obstructive pulmonary disease. Electronically Signed   By: Lupita Raider, M.D.   On: 12/03/2016 16:47    ____________________________________________   PROCEDURES  Procedure(s) performed: None  Procedures  Critical Care performed: No ____________________________________________   INITIAL IMPRESSION / ASSESSMENT AND PLAN / ED COURSE  Pertinent labs & imaging results that were available during my care of the patient were reviewed by me and considered in my medical decision making (see chart for details).  41 y.o. male with a history of ongoing tobacco abuse and strong family history of early CAD presenting with exertional chest pain associated with radiation down the left arm, shortness of breath, lightheadedness and diaphoresis. Overall, the patient does have some signs and symptoms that would be consistent with dehydration and hypovolemia causing his tachycardia. We'll proceed with intravenous fluids and get orthostatics on this patient. He does have a mild bump in his creatinine, which also would be consistent with dehydration. However, given his risk factors and significant symptoms, ACS or MI is also possible. He is also a thin tall male, so pneumothorax spontaneous is also possible but I do hear breath sounds throughout his lung fields.  PE, while possible, is lower on the differential. I'm awaiting the results of his chest x-ray, and we'll treat the patient with much glycerin aspirin. The patient will require admission for further evaluation and treatment.  ----------------------------------------- 5:19 PM on 12/03/2016 -----------------------------------------  The patient is orthostatic on examination. Plan admission at this time.  ____________________________________________  FINAL CLINICAL IMPRESSION(S) / ED DIAGNOSES  Final diagnoses:  Renal insufficiency  Chest pain, unspecified type  Shortness of breath  Lightheadedness  Sinus tachycardia  Orthostasis          NEW MEDICATIONS STARTED DURING THIS VISIT:  New Prescriptions   No medications on file      Rockne Menghini, MD 12/03/16 1651    Rockne Menghini, MD 12/03/16 1719    Rockne Menghini, MD 12/03/16 1722

## 2016-12-04 ENCOUNTER — Observation Stay (HOSPITAL_BASED_OUTPATIENT_CLINIC_OR_DEPARTMENT_OTHER): Payer: Self-pay

## 2016-12-04 ENCOUNTER — Encounter: Payer: Self-pay | Admitting: Radiology

## 2016-12-04 DIAGNOSIS — R0789 Other chest pain: Secondary | ICD-10-CM

## 2016-12-04 DIAGNOSIS — R079 Chest pain, unspecified: Secondary | ICD-10-CM

## 2016-12-04 LAB — NM MYOCAR MULTI W/SPECT W/WALL MOTION / EF
CHL CUP NUCLEAR SRS: 0
CHL CUP NUCLEAR SSS: 0
CHL CUP RESTING HR STRESS: 62 {beats}/min
CSEPHR: 54 %
LV dias vol: 73 mL (ref 62–150)
LV sys vol: 32 mL
Peak HR: 96 {beats}/min
SDS: 2
TID: 1.08

## 2016-12-04 LAB — TROPONIN I

## 2016-12-04 MED ORDER — TECHNETIUM TC 99M TETROFOSMIN IV KIT
12.2660 | PACK | Freq: Once | INTRAVENOUS | Status: AC | PRN
  Administered 2016-12-04: 12.266 via INTRAVENOUS

## 2016-12-04 MED ORDER — TECHNETIUM TC 99M TETROFOSMIN IV KIT
30.0000 | PACK | Freq: Once | INTRAVENOUS | Status: AC | PRN
  Administered 2016-12-04: 32.425 via INTRAVENOUS

## 2016-12-04 MED ORDER — NICOTINE 21 MG/24HR TD PT24: 21.0000 mg | MEDICATED_PATCH | Freq: Every day | TRANSDERMAL | 0 refills | Status: DC

## 2016-12-04 MED ORDER — REGADENOSON 0.4 MG/5ML IV SOLN
0.4000 mg | Freq: Once | INTRAVENOUS | Status: AC
  Administered 2016-12-04: 0.4 mg via INTRAVENOUS

## 2016-12-04 MED ORDER — IBUPROFEN 400 MG PO TABS: 400.0000 mg | ORAL_TABLET | Freq: Four times a day (QID) | ORAL | 0 refills | Status: DC | PRN

## 2016-12-04 NOTE — Progress Notes (Signed)
Initial Nutrition Assessment  DOCUMENTATION CODES:   Underweight  INTERVENTION:  1. Monitor for diet advancement, provide snacks as needed  NUTRITION DIAGNOSIS:   Inadequate oral intake related to poor appetite as evidenced by per patient/family report.  GOAL:   Patient will meet greater than or equal to 90% of their needs  MONITOR:   PO intake, I & O's, Labs, Weight trends, Supplement acceptance  REASON FOR ASSESSMENT:   Other (Comment) (Low BMI)    ASSESSMENT:   Antonio Figueroa  is a 41 y.o. male with a known history of Tobacco abuse who presented to the hospital due to chest pain. Patient says he developed some chest pain yesterday while walking in his backyard.  Spoke with Antonio Figueroa at bedside, he was not very talkative states he was trying to get some sleep. Normally eats cereal for breakfast, pizza or hamburgers for lunch and dinner. Sometimes eats out. Doesn't seem to consume much given his height but also states that people in his family are notoriously thin. He appears to still have muscle, and body fat despite his bodyweight and BMI. Suspect tobacco abuse has a significant impact on his bodyweight related to appetite suppression. Reports poor appetite x1 week PTA with 3-4# wt loss, but otherwise is ok. No hx of issues chewing/swallowing/choking No nausea/vomiting/diarrhea/constipation NPO for stress test.  Labs and medications reviewed.  Diet Order:  Diet NPO time specified Except for: Ice Chips, Sips with Meds Diet general  Skin:  Reviewed, no issues  Last BM:  12/03/2016  Height:   Ht Readings from Last 1 Encounters:  12/03/16  (1.93 m)    Weight:   Wt Readings from Last 1 Encounters:  12/03/16 124 lb (56.2 kg)    Ideal Body Weight:  91.81 kg  BMI:  Body mass index is 15.09 kg/m.  Estimated Nutritional Needs:   Kcal:  1700-2000 calories  Protein:  67-85 gm  Fluid:  >/= 1.7L  EDUCATION NEEDS:   No education needs identified at  this time  Antonio Ano. Jerimah Witucki, MS, RD LDN Inpatient Clinical Dietitian Pager [phone]

## 2016-12-04 NOTE — Progress Notes (Signed)
Discharge instructions explained to pt/ verbalized an understanding/ iv and tele removed/ will transport off unit via wheelchair when his ride arrives

## 2016-12-04 NOTE — Care Management (Signed)
Placed in observation for chest pain.  Does not have insurance or PCP.  He currently is not on any maintenance medications.  He is independent in adls, has transportation and shelter.  At present, patient does not appear to be discharging on any meds.  Provided him with [facility] four dollar meds and [facility]

## 2016-12-04 NOTE — Progress Notes (Signed)
[facility] - [place] at [facility]   PATIENT NAME: Antonio Figueroa    MR#:  [id number]  DATE OF BIRTH:  12-Feb-1976  SUBJECTIVE:  CHIEF COMPLAINT:   Chief Complaint  Patient presents with  . Chest Pain  . Dizziness   - Admitted with chest pain, likely noncardiac. -Myoview this morning. Still has minimal chest pain but no dyspnea  REVIEW OF SYSTEMS:  Review of Systems  Constitutional: Negative for chills, fever and malaise/fatigue.  HENT: Negative for congestion, ear discharge, hearing loss and nosebleeds.   Eyes: Negative for blurred vision and double vision.  Respiratory: Negative for cough, shortness of breath and wheezing.   Cardiovascular: Positive for chest pain. Negative for palpitations and leg swelling.  Gastrointestinal: Negative for abdominal pain, constipation, diarrhea, nausea and vomiting.  Genitourinary: Negative for dysuria.  Musculoskeletal: Negative for myalgias.  Neurological: Negative for dizziness, sensory change, speech change, focal weakness, seizures and headaches.  Psychiatric/Behavioral: Negative for depression.    DRUG ALLERGIES:  No Known Allergies  VITALS:  Blood pressure 105/66, pulse (!) 54, temperature 97.9 F (36.6 C), temperature source Oral, resp. rate 18, height  (1.93 m), weight 56.2 kg (124 lb), SpO2 97 %.  PHYSICAL EXAMINATION:  Physical Exam  GENERAL:  41 y.o.-year-old patient lying in the bed with no acute distress.  EYES: Pupils equal, round, reactive to light and accommodation. No scleral icterus. Extraocular muscles intact.  HEENT: Head atraumatic, normocephalic. Oropharynx and nasopharynx clear.  NECK:  Supple, no jugular venous distention. No thyroid enlargement, no tenderness.  LUNGS: Normal breath sounds bilaterally, no wheezing, rales,rhonchi or crepitation. No use of accessory muscles of respiration.  CARDIOVASCULAR: S1, S2 normal. No murmurs, rubs, or gallops. No Chest wall tenderness ABDOMEN:  Soft, nontender, nondistended. Bowel sounds present. No organomegaly or mass.  EXTREMITIES: No pedal edema, cyanosis, or clubbing.  NEUROLOGIC: Cranial nerves II through XII are intact. Muscle strength 5/5 in all extremities. Sensation intact. Gait not checked.  PSYCHIATRIC: The patient is alert and oriented x 3.  SKIN: No obvious rash, lesion, or ulcer.    LABORATORY PANEL:   CBC  Recent Labs Lab 12/03/16 1532  WBC 9.4  HGB 15.7  HCT 46.5  PLT 254   ------------------------------------------------------------------------------------------------------------------  Chemistries   Recent Labs Lab 12/03/16 1532  NA 134*  K 3.7  CL 101  CO2 23  GLUCOSE 135*  BUN 12  CREATININE 1.29*  CALCIUM 9.3   ------------------------------------------------------------------------------------------------------------------  Cardiac Enzymes  Recent Labs Lab 12/04/16 0335  TROPONINI <0.03   ------------------------------------------------------------------------------------------------------------------  RADIOLOGY:  Dg Chest 2 View  Result Date: 12/03/2016 CLINICAL DATA:  Chest pain. EXAM: CHEST  2 VIEW COMPARISON:  Radiographs of August 20, 2005. FINDINGS: The heart size and mediastinal contours are within normal limits. Both lungs are clear. No pneumothorax or pleural effusion is noted. Hyperexpansion of the lungs is noted suggesting chronic obstructive pulmonary disease. The visualized skeletal structures are unremarkable. IMPRESSION: No active cardiopulmonary disease. Findings consistent with chronic obstructive pulmonary disease. Electronically Signed   By: Lupita Raider, M.D.   On: 12/03/2016 16:47    EKG:   Orders placed or performed during the hospital encounter of 12/03/16  . ED EKG within 10 minutes  . ED EKG within 10 minutes  . EKG 12-Lead  . EKG 12-Lead  . EKG 12-Lead  . EKG 12-Lead  . EKG 12-Lead  . EKG 12-Lead    ASSESSMENT AND PLAN:   41 year old male  with no significant past  medical history other than ongoing smoking presents with left-sided chest pain.  #1 chest pain-likely musculoskeletal pain. -However due to risk factors of family history and smoking, Myoview has been ordered. -Ruled out for MI, troponins negative 3 in-if Myoview is negative, can be discharged. Advised to take Motrin as needed for pain. -Aspirin.  #2 tobacco use disorder-on nicotine patch  #3 DVT prophylaxis-on Lovenox  Possible discharge today     All the records are reviewed and case discussed with Care Management/Social Workerr. Management plans discussed with the patient, family and they are in agreement.  CODE STATUS: Full Code  TOTAL TIME TAKING CARE OF THIS PATIENT: 38 minutes.   POSSIBLE D/C TODAY DEPENDING ON CLINICAL CONDITION.   Enid Baas M.D on 12/04/2016 at 8:31 AM  Between 7am to 6pm - Pager - [phone]  After 6pm go to [email or web address] - Social research officer, government  [facility]  Office  [phone]  CC: Primary care physician; No PCP Per Patient

## 2016-12-05 LAB — HIV ANTIBODY (ROUTINE TESTING W REFLEX): HIV Screen 4th Generation wRfx: NONREACTIVE

## 2016-12-10 NOTE — Discharge Summary (Signed)
[employer] - [facility]   PATIENT NAME: Antonio Figueroa    MR#:  [id number]  DATE OF BIRTH:  10-19-1975  DATE OF ADMISSION:  12/03/2016   ADMITTING PHYSICIAN: Houston Siren, MD  DATE OF DISCHARGE: 12/04/2016  6:39 PM  PRIMARY CARE PHYSICIAN: No PCP Per Patient   ADMISSION DIAGNOSIS:   Shortness of breath [R06.02] Sinus tachycardia [R00.0] Lightheadedness [R42] Orthostasis [I95.1] Renal insufficiency [N28.9] Chest pain, unspecified type [R07.9]  DISCHARGE DIAGNOSIS:   Active Problems:   Chest pain   SECONDARY DIAGNOSIS:   Past Medical History:  Diagnosis Date  . Tobacco abuse     HOSPITAL COURSE:   41 year old male with no significant past medical history other than ongoing smoking presents with left-sided chest pain.  #1 chest pain-likely musculoskeletal pain. -However due to risk factors of family history and smoking, Myoview has been done and proved to be low risk and no evidence of ischemia. -Ruled out for MI, troponins negative 3 in - Advised to take Motrin as needed for pain.  #2 tobacco use disorder-discharged on nicotine patch  Stable and being discharged   DISCHARGE CONDITIONS:   Stable  CONSULTS OBTAINED:   None  DRUG ALLERGIES:   No Known Allergies DISCHARGE MEDICATIONS:   Allergies as of 12/04/2016   No Known Allergies     Medication List    TAKE these medications   ibuprofen 400 MG tablet Commonly known as:  ADVIL,MOTRIN Take 1 tablet (400 mg total) by mouth every 6 (six) hours as needed.   nicotine 21 mg/24hr patch Commonly known as:  NICODERM CQ - dosed in mg/24 hours Place 1 patch (21 mg total) onto the skin daily.        DISCHARGE INSTRUCTIONS:   1. PCP f/u in 1 week  DIET:   Regular diet   ACTIVITY:   As tolerated  OXYGEN:   Home Oxygen: No Oxygen Delivery: room air  DISCHARGE LOCATION:   Home  If you experience worsening of your admission symptoms, develop  shortness of breath, life threatening emergency, suicidal or homicidal thoughts you must seek medical attention immediately by calling 911 or calling your MD immediately  if symptoms less severe.  You Must read complete instructions/literature along with all the possible adverse reactions/side effects for all the Medicines you take and that have been prescribed to you. Take any new Medicines after you have completely understood and accpet all the possible adverse reactions/side effects.   Please note  You were cared for by a hospitalist during your hospital stay. If you have any questions about your discharge medications or the care you received while you were in the hospital after you are discharged, you can call the unit and asked to speak with the hospitalist on call if the hospitalist that took care of you is not available. Once you are discharged, your primary care physician will handle any further medical issues. Please note that NO REFILLS for any discharge medications will be authorized once you are discharged, as it is imperative that you return to your primary care physician (or establish a relationship with a primary care physician if you do not have one) for your aftercare needs so that they can reassess your need for medications and monitor your lab values.    On the day of Discharge:  VITAL SIGNS:   Blood pressure 121/76, pulse (!) 55, temperature 97.9 F (36.6 C), temperature source Oral, resp. rate 18, height  (1.93 m), weight  56.2 kg (124 lb), SpO2 100 %.  PHYSICAL EXAMINATION:    GENERAL:  41 y.o.-year-old patient lying in the bed with no acute distress.  EYES: Pupils equal, round, reactive to light and accommodation. No scleral icterus. Extraocular muscles intact.  HEENT: Head atraumatic, normocephalic. Oropharynx and nasopharynx clear.  NECK:  Supple, no jugular venous distention. No thyroid enlargement, no tenderness.  LUNGS: Normal breath sounds bilaterally, no  wheezing, rales,rhonchi or crepitation. No use of accessory muscles of respiration.  CARDIOVASCULAR: S1, S2 normal. No murmurs, rubs, or gallops. No chest wall tenderness, pain mostly using the left arm and with movement. ABDOMEN: Soft, non-tender, non-distended. Bowel sounds present. No organomegaly or mass.  EXTREMITIES: No pedal edema, cyanosis, or clubbing.  NEUROLOGIC: Cranial nerves II through XII are intact. Muscle strength 5/5 in all extremities. Sensation intact. Gait not checked.  PSYCHIATRIC: The patient is alert and oriented x 3.  SKIN: No obvious rash, lesion, or ulcer.   DATA REVIEW:   CBC  Recent Labs Lab 12/03/16 1532  WBC 9.4  HGB 15.7  HCT 46.5  PLT 254    Chemistries   Recent Labs Lab 12/03/16 1532  NA 134*  K 3.7  CL 101  CO2 23  GLUCOSE 135*  BUN 12  CREATININE 1.29*  CALCIUM 9.3     Microbiology Results  No results found for this or any previous visit.  RADIOLOGY:  No results found.   Management plans discussed with the patient, family and they are in agreement.  CODE STATUS:  Code Status History    Date Active Date Inactive Code Status Order ID Comments User Context   12/03/2016  7:06 PM 12/04/2016  9:44 PM Full Code [id number]  Houston Siren, MD Inpatient      TOTAL TIME TAKING CARE OF THIS PATIENT: 37 minutes.    Enid Baas M.D on 12/10/2016 at 11:13 AM  Between 7am to 6pm - Pager - [phone]  After 6pm go to [email or web address] - Scientist, research (life sciences) [facility]  Office  [phone]  CC: Primary care physician; No PCP Per Patient   Note: This dictation was prepared with Dragon dictation along with smaller phrase technology. Any transcriptional errors that result from this process are unintentional.

## 2017-01-20 ENCOUNTER — Emergency Department
Admission: EM | Admit: 2017-01-20 | Discharge: 2017-01-20 | Disposition: A | Discharge status: Home / Self Care | Payer: Self-pay | Attending: Emergency Medicine | Admitting: Emergency Medicine

## 2017-01-20 ENCOUNTER — Emergency Department: Payer: Self-pay

## 2017-01-20 DIAGNOSIS — R9389 Abnormal findings on diagnostic imaging of other specified body structures: Secondary | ICD-10-CM

## 2017-01-20 DIAGNOSIS — W1842XS Slipping, tripping and stumbling without falling due to stepping into hole or opening, sequela: Secondary | ICD-10-CM | POA: Insufficient documentation

## 2017-01-20 DIAGNOSIS — Y998 Other external cause status: Secondary | ICD-10-CM | POA: Insufficient documentation

## 2017-01-20 DIAGNOSIS — R937 Abnormal findings on diagnostic imaging of other parts of musculoskeletal system: Secondary | ICD-10-CM | POA: Insufficient documentation

## 2017-01-20 DIAGNOSIS — Y929 Unspecified place or not applicable: Secondary | ICD-10-CM | POA: Insufficient documentation

## 2017-01-20 DIAGNOSIS — S83005A Unspecified dislocation of left patella, initial encounter: Secondary | ICD-10-CM | POA: Insufficient documentation

## 2017-01-20 DIAGNOSIS — Y9301 Activity, walking, marching and hiking: Secondary | ICD-10-CM | POA: Insufficient documentation

## 2017-01-20 DIAGNOSIS — F17210 Nicotine dependence, cigarettes, uncomplicated: Secondary | ICD-10-CM | POA: Insufficient documentation

## 2017-01-20 MED ORDER — IBUPROFEN 600 MG PO TABS: 600.0000 mg | ORAL_TABLET | Freq: Three times a day (TID) | ORAL | 0 refills | Status: DC | PRN

## 2017-01-20 NOTE — ED Provider Notes (Signed)
[facility]-EMERGENCY DEPARTMENT Provider Note   CSN: [id number] Arrival date & time: 01/20/17  1646     History   Chief Complaint Chief Complaint  Patient presents with  . Knee Pain    HPI Antonio Figueroa is a 41 y.o. male presents to the emergency department for evaluation of left knee pain. Patient states yesterday he stepped into a hole, fell to his left patella dislocate medially. He had a large effusion with significant swelling. Today, pain and swelling have improved the continues to have some pain with squatting and bending. Patient's pain is 4 out of 10. He has not had any medications for pain. Denies any numbness or tingling. Denies any pain behind the knee. Currently ambulatory with no assisted devices.  HPI  Past Medical History:  Diagnosis Date  . Tobacco abuse     Patient Active Problem List   Diagnosis Date Noted  . Chest pain 12/03/2016  . DENTAL CARIES LIMITED TO ENAMEL 11/17/2007  . THIGH, PAIN 11/05/2007  . ERECTILE DYSFUNCTION 07/15/2007  . ANKLE INJURY, RIGHT 07/15/2007  . TIA 01/27/2007  . 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 ANXIETY DISORDER 06/22/2006  . PANIC DISORDER 06/22/2006  . TOBACCO ABUSE 06/22/2006  . 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 TONSILLECTOMY         Home Medications    Prior to Admission medications   Medication Sig Start Date End Date Taking? Authorizing Provider  ibuprofen (ADVIL,MOTRIN) 600 MG tablet Take 1 tablet (600 mg total) by mouth every 8 (eight) hours as needed for moderate pain. 01/20/17   Evon SlackGaines, Thomas C, PA-C  nicotine (NICODERM CQ - DOSED IN MG/24 HOURS) 21 mg/24hr patch Place 1 patch (21 mg total) onto the skin daily. 12/04/16   Enid BaasKalisetti, Radhika, MD    Family History Family History  Problem Relation Age of Onset  . Diabetes Father   . CVA Father   . Heart disease Paternal Grandmother     Social History Social History  Substance Use Topics  . Smoking  status: Current Every Day Smoker    Packs/day: 1.00    Years: 30.00    Types: Cigarettes  . Smokeless tobacco: Never Used  . Alcohol use No     Allergies   Patient has no known allergies.   Review of Systems Review of Systems  Constitutional: Negative.  Negative for activity change, appetite change, chills and fever.  HENT: Negative for congestion, ear pain, mouth sores, rhinorrhea, sinus pressure, sore throat and trouble swallowing.   Eyes: Negative for photophobia, pain and discharge.  Respiratory: Negative for cough, chest tightness and shortness of breath.   Cardiovascular: Negative for chest pain and leg swelling.  Gastrointestinal: Negative for abdominal distention, abdominal pain, diarrhea, nausea and vomiting.  Genitourinary: Negative for difficulty urinating and dysuria.  Musculoskeletal: Positive for arthralgias. Negative for back pain and gait problem.  Skin: Negative for color change and rash.  Neurological: Negative for dizziness and headaches.  Hematological: Negative for adenopathy.  Psychiatric/Behavioral: Negative for agitation and behavioral problems.     Physical Exam Updated Vital Signs BP 113/88 (BP Location: Left Arm)   Pulse 89   Temp 97.7 F (36.5 C) (Oral)   Resp 16   Ht 6\' 5"  (1.956 m)   Wt 70.8 kg (156 lb)   SpO2 99%   BMI 18.50 kg/m   Physical Exam  Constitutional: He appears well-developed and well-nourished.  HENT:  Head: Normocephalic and atraumatic.  Eyes: Conjunctivae are normal.  Neck: Neck supple.  Cardiovascular: Normal rate.   Pulmonary/Chest: Effort normal. No respiratory distress.  Musculoskeletal:  Examination of the left lower extremity shows patient is able to straight leg raise. Knee is stable to valgus and varus stress testing. He is tender along the patellar region with no laxity with patella compression medially or laterally. He has no sign of effusion, warmth or erythema. He is able to bend the knee to 120. He is  nontender along the posterior popliteal region. No edema throughout the lower extremities, negative Homans sign bilaterally.  Neurological: He is alert.  Skin: Skin is warm and dry.  Psychiatric: He has a normal mood and affect.  Nursing note and vitals reviewed.    ED Treatments / Results  Labs (all labs ordered are listed, but only abnormal results are displayed) Labs Reviewed - No data to display  EKG  EKG Interpretation None       Radiology Dg Knee Complete 4 Views Left  Result Date: 01/20/2017 CLINICAL DATA:  Left knee pain after stepping in a hole yesterday. Twisting injury. EXAM: LEFT KNEE - COMPLETE 4+ VIEW FINDINGS: Mild marginal spurring of the patella.  No knee effusion. No visible acute fracture. Posterior to the distal femoral metaphysis, there is an irregularly calcified 3.7 by 1.4 by 1.8 cm structure. Slight lucency separating this from the adjacent metaphysis. IMPRESSION: 1. 3.7 by 1.4 by 1.8 cm irregularly calcified mass along the posterior margin of the distal femoral metaphysis, slight lucency separating this from the metaphysis. 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 Differential diagnostic considerations include cortical desmoid, myositis ossificans, and juxtacortical chondrosarcoma. Follow up MRI of the knee with an without contrast is recommended for further workup, although this is not necessarily required on an emergency basis. Electronically Signed   By: Gaylyn Rong M.D.   On: 01/20/2017 18:21    Procedures Procedures (including critical care time) SPLINT APPLICATION Date/Time: 7:13 PM Authorized by: Patience Musca Consent: Verbal consent obtained. Risks and benefits: risks, benefits and alternatives were discussed Consent given by: patient Splint applied by: Physician Asst. Location details: Left leg  Splint type: Short leg knee immobilizer  Supplies used: Knee immobilizer    Post-procedure: The splinted body part was neurovascularly unchanged following the procedure. Patient tolerance: Patient tolerated the procedure well with no immediate complications.       Medications Ordered in ED Medications - No data to display   Initial Impression / Assessment and Plan / ED Course  I have reviewed the triage vital signs and the nursing notes.  Pertinent labs & imaging results that were available during my care of the patient were reviewed by me and considered in my medical decision making (see chart for details).     41 year old male with left knee pain, describes patella dislocation medially. First ever patella dislocation. He describes initial effusion but no effusion today. X-rays show no evidence of acute bony abnormality but there is suspicion for abnormal bony lesion along the posterior knee joint. Would recommend follow-up with orthopedist for possible MRI with and without contrast. Patient will call orthopedics tomorrow to schedule follow-up appointment. Patient is placed into a knee immobilizer today, will wear at all times except for showering and sleeping. He'll take ibuprofen as needed for pain. He is educated on signs and symptoms return to the ED for.  Final Clinical Impressions(s) / ED Diagnoses   Final diagnoses:  Patellar dislocation, left, initial encounter  Abnormal x-ray    New Prescriptions Discharge Medication List as of 01/20/2017  6:43 PM       Evon Slack, PA-C 01/20/17 1914    Merrily Brittle, MD 01/20/17 2043

## 2017-01-20 NOTE — Discharge Instructions (Signed)
Please continue with knee brace, rest ice and elevate the left knee. Take Tylenol and/or ibuprofen as needed for pain. Call orthopedics office tomorrow morning and schedule follow-up appointment. He may discontinue knee brace at nighttime as well as with showering.

## 2017-01-20 NOTE — ED Notes (Signed)
See triage note  States stepped in hole  Twisted left knee  Ambulates well   No deformity noted

## 2017-01-20 NOTE — ED Triage Notes (Signed)
Pt states he stepped in a hole yesterday and twisted his left knee and is having pain, pt is ambulatory without difficulty to triage.

## 2017-05-07 ENCOUNTER — Encounter: Payer: Self-pay | Admitting: Emergency Medicine

## 2017-05-07 ENCOUNTER — Emergency Department
Admission: EM | Admit: 2017-05-07 | Discharge: 2017-05-07 | Disposition: A | Discharge status: Home / Self Care | Payer: Self-pay | Attending: Student in an Organized Health Care Education/Training Program | Admitting: Student in an Organized Health Care Education/Training Program

## 2017-05-07 ENCOUNTER — Emergency Department: Payer: Self-pay

## 2017-05-07 DIAGNOSIS — Z79899 Other long term (current) drug therapy: Secondary | ICD-10-CM | POA: Insufficient documentation

## 2017-05-07 DIAGNOSIS — R1011 Right upper quadrant pain: Secondary | ICD-10-CM

## 2017-05-07 DIAGNOSIS — F17210 Nicotine dependence, cigarettes, uncomplicated: Secondary | ICD-10-CM | POA: Insufficient documentation

## 2017-05-07 LAB — BASIC METABOLIC PANEL
Anion gap: 9 (ref 5–15)
BUN: 10 mg/dL (ref 6–20)
CALCIUM: 9.1 mg/dL (ref 8.9–10.3)
CO2: 27 mmol/L (ref 22–32)
CREATININE: 1.16 mg/dL (ref 0.61–1.24)
Chloride: 101 mmol/L (ref 101–111)
GFR calc Af Amer: >60 mL/min (ref >60)
GLUCOSE: 109 mg/dL — AB (ref 65–99)
Potassium: 4.3 mmol/L (ref 3.5–5.1)
SODIUM: 137 mmol/L (ref 135–145)

## 2017-05-07 LAB — HEPATIC FUNCTION PANEL
ALBUMIN: 4.2 g/dL (ref 3.5–5.0)
ALK PHOS: 54 U/L (ref 38–126)
ALT: 10 U/L — ABNORMAL LOW (ref 17–63)
AST: 19 U/L (ref 15–41)
Bilirubin, Direct: <0.1 mg/dL — ABNORMAL LOW (ref 0.1–0.5)
TOTAL PROTEIN: 6.9 g/dL (ref 6.5–8.1)
Total Bilirubin: 0.2 mg/dL — ABNORMAL LOW (ref 0.3–1.2)

## 2017-05-07 LAB — CBC
HEMATOCRIT: 41.9 % (ref 40.0–52.0)
HEMOGLOBIN: 14.7 g/dL (ref 13.0–18.0)
MCH: 33.3 pg (ref 26.0–34.0)
MCHC: 35 g/dL (ref 32.0–36.0)
MCV: 95 fL (ref 80.0–100.0)
Platelets: 211 10*3/uL (ref 150–440)
RBC: 4.41 MIL/uL (ref 4.40–5.90)
RDW: 13.1 % (ref 11.5–14.5)
WBC: 6.9 10*3/uL (ref 3.8–10.6)

## 2017-05-07 LAB — TROPONIN I: Troponin I: <0.03 ng/mL (ref <0.03)

## 2017-05-07 LAB — LIPASE, BLOOD: LIPASE: 27 U/L (ref 11–51)

## 2017-05-07 MED ORDER — PROMETHAZINE HCL 25 MG/ML IJ SOLN
INTRAMUSCULAR | Status: AC
  Filled 2017-05-07: qty 1

## 2017-05-07 MED ORDER — RANITIDINE HCL 150 MG PO TABS
150.0000 mg | ORAL_TABLET | Freq: Two times a day (BID) | ORAL | 1 refills | Status: DC
Start: 2017-05-07 — End: 2017-10-19

## 2017-05-07 MED ORDER — ONDANSETRON HCL 4 MG PO TABS: 4.0000 mg | ORAL_TABLET | Freq: Every day | ORAL | 0 refills | Status: DC | PRN

## 2017-05-07 MED ORDER — PROMETHAZINE HCL 25 MG/ML IJ SOLN
25.0000 mg | Freq: Four times a day (QID) | INTRAMUSCULAR | Status: DC | PRN
  Administered 2017-05-07: 25 mg via INTRAMUSCULAR

## 2017-05-07 NOTE — ED Triage Notes (Signed)
Pt reports left side chest pain radiating to left arm. Pt reports nausea, vomiting, back pain for one week. Pt reports dizziness associated with CP. Pt reports was told five years ago he needed to have his gallbladder out. Pt also reports abdominal pain and epigastric pain that increases with eating especially spicy or fatty foods.

## 2017-05-07 NOTE — ED Notes (Signed)
Coffee provided to pt per MD orders.

## 2017-05-07 NOTE — ED Provider Notes (Signed)
[facility] Emergency Department Provider Note    First MD Initiated Contact with Patient 05/07/17 1719     (approximate)  I have reviewed the triage vital signs and the nursing notes.   HISTORY  Chief Complaint RUQ pain   HPI Antonio Figueroa is a 41 y.o. male History of smoking presents with right upper quadrant pain radiating into his chest associated with nausea and nonbloody nonbilious vomiting. States the pain woke him up this morning. States he had similar pain roughly 5 years ago when he was seen in a hospital in [place] and was told that he needed to have his gallbladder removed but he left AMA. States he did not have any issues since then. States he has been having intermittent nausea and vomiting for the past week. Denies any chest pain at this time. No shortness of breath. Describes the pain as colicky in mild to moderate severity.  Worse with eating spicy or fatty foods.   Past Medical History:  Diagnosis Date  . Tobacco abuse    Family History  Problem Relation Age of Onset  . Diabetes Father   . CVA Father   . Heart disease Paternal Grandmother    Past Surgical History:  Procedure Laterality Date  . TONSILLECTOMY     Patient Active Problem List   Diagnosis Date Noted  . Chest pain 12/03/2016  . DENTAL CARIES LIMITED TO ENAMEL 11/17/2007  . THIGH, PAIN 11/05/2007  . ERECTILE DYSFUNCTION 07/15/2007  . ANKLE INJURY, RIGHT 07/15/2007  . TIA 01/27/2007  . 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 ANXIETY DISORDER 06/22/2006  . PANIC DISORDER 06/22/2006  . TOBACCO ABUSE 06/22/2006  . 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 Authorizing Provider  ibuprofen (ADVIL,MOTRIN) 600 MG tablet Take 1 tablet (600 mg total) by mouth every 8 (eight) hours as needed for moderate pain. 01/20/17   Evon Slack, PA-C  nicotine (NICODERM CQ - DOSED IN MG/24 HOURS) 21 mg/24hr patch  Place 1 patch (21 mg total) onto the skin daily. 12/04/16   Enid Baas, MD    Allergies Patient has no known allergies.    Social History Social History  Substance Use Topics  . Smoking status: Current Every Day Smoker    Packs/day: 1.00    Years: 30.00    Types: Cigarettes  . Smokeless tobacco: Never Used  . Alcohol use No    Review of Systems Patient denies headaches, rhinorrhea, blurry vision, numbness, shortness of breath, chest pain, edema, cough, abdominal pain, nausea, vomiting, diarrhea, dysuria, fevers, rashes or hallucinations unless otherwise stated above in HPI. ____________________________________________   PHYSICAL EXAM:  VITAL SIGNS: Vitals:   05/07/17 1622  BP: 127/83  Pulse: 88  Resp: 18  Temp: 98 F (36.7 C)  SpO2: 99%    Constitutional: Alert and oriented. Anxious appearing, in no acute distress. Eyes: Conjunctivae are normal.  Head: Atraumatic. Nose: No congestion/rhinnorhea. Mouth/Throat: Mucous membranes are moist.   Neck: No stridor. Painless ROM.  Cardiovascular: Normal rate, regular rhythm. Grossly normal heart sounds.  Good peripheral circulation. Respiratory: Normal respiratory effort.  No retractions. Lungs CTAB. Gastrointestinal: Soft, ttp of RUQ with voluntary guarding. No distention. No abdominal bruits. No CVA tenderness. Genitourinary:  Musculoskeletal: No lower extremity tenderness nor edema.  No joint effusions. Neurologic:  Normal speech and language. No gross focal neurologic deficits are appreciated. No facial droop Skin:  Skin is warm, dry  and intact. No rash noted. Psychiatric: Mood and affect are normal. Speech and behavior are normal.  ____________________________________________   LABS (all labs ordered are listed, but only abnormal results are displayed)  Results for orders placed or performed during the hospital encounter of 05/07/17 (from the past 24 hour(s))  CBC     Status: None   Collection Time:  05/07/17  4:23 PM  Result Value Ref Range   WBC 6.9 3.8 - 10.6 K/uL   RBC 4.41 4.40 - 5.90 MIL/uL   Hemoglobin 14.7 13.0 - 18.0 g/dL   HCT 16.1 09.6 - 04.5 %   MCV 95.0 80.0 - 100.0 fL   MCH 33.3 26.0 - 34.0 pg   MCHC 35.0 32.0 - 36.0 g/dL   RDW 40.9 81.1 - 91.4 %   Platelets 211 150 - 440 K/uL   ____________________________________________  EKG My review and personal interpretation at Time: 16:15   Indication: ruq pain  Rate: 85  Rhythm: sinus Axis: normal Other: normal intervals, no stemi,  ____________________________________________  RADIOLOGY  I personally reviewed all radiographic images ordered to evaluate for the above acute complaints and reviewed radiology reports and findings.  These findings were personally discussed with the patient.  Please see medical record for radiology report.  ____________________________________________   PROCEDURES  Procedure(s) performed:  Procedures    Critical Care performed: no ____________________________________________   INITIAL IMPRESSION / ASSESSMENT AND PLAN / ED COURSE  Pertinent labs & imaging results that were available during my care of the patient were reviewed by me and considered in my medical decision making (see chart for details).  DDX: gastritis, choleithiasis, cholecystitis, pancreatitis, pna, copd, acs  Edie Darley Jue is a 41 y.o. who presents to the ED with right upper quadrant pain radiated into his chest after he ate a [employer] cheeseburger. Patient afebrile and hemodynamically stable. Ambulating in the room in no acute distress. No diaphoresis or shortness of breath. EKG shows no evidence of acute ischemia. Chest x-ray does show evidence of chronic bronchitic changes but no evidence of pneumonia and pneumothorax or free air. Patient is otherwise well appearing. Will order ultrasound evaluate for the above differential.  Clinical Course as of May 08 1915  Thu May 07, 2017  1910 BUN: 10 [PR]    Clinical  Course User Index [PR] Willy Eddy, MD    ----------------------------------------- 7:17 PM on 05/07/2017 -----------------------------------------  Repeat abdominal exam soft and benign. Patient denies any persistent chest pain or shortness of breath and states he's been having these symptoms initially starting last week but today's episode was more severe this morning. Troponin is negative do not feel this clinically consistent with ACS or angina.  Heart score of 2. LFTs and bilirubin are also reassuring. No evidence of infectious process. Right upper quadrant ultrasound shows some chronic wall thickening but no evidence of pericholecystic fluid or stones. Patient is tolerating oral hydration. His pain is controlled. At this point we believe he is stable for further workup as an outpatient. Discussed strict return precautions. ____________________________________________   FINAL CLINICAL IMPRESSION(S) / ED DIAGNOSES  Final diagnoses:  RUQ pain      NEW MEDICATIONS STARTED DURING THIS VISIT:  New Prescriptions   No medications on file     Note:  This document was prepared using Dragon voice recognition software and may include unintentional dictation errors.    Willy Eddy, MD 05/07/17 1919

## 2017-05-07 NOTE — Discharge Instructions (Signed)

## 2017-05-07 NOTE — ED Notes (Signed)
Pt reports that he awoke this AM "in sweats", describes pain to RUQ of abdomen and chest area. Hx of gallbladder problems per patient. PT requesting that "if it is my gallbladder, yall take it out". Pt appears anxious.

## 2017-05-07 NOTE — ED Notes (Signed)
ED Provider at bedside. 

## 2017-05-12 ENCOUNTER — Telehealth: Payer: Self-pay | Admitting: General Practice

## 2017-05-12 NOTE — Telephone Encounter (Signed)
Left a message for the patient to call the office, patient was seen in the ED and needs a follow up appointment. Please schedule appointment for patient if he calls back. Patient can be placed on the schedule for next available surgeon for ED Follow-up (05/07/17): Biliary Colic.

## 2017-05-19 ENCOUNTER — Encounter: Payer: Self-pay | Admitting: General Practice

## 2017-05-19 NOTE — Telephone Encounter (Signed)
Unable to reach patient on phone, I have mailed a letter for the patient to call the office to make an appointment with one of our surgeons.

## 2017-09-21 ENCOUNTER — Other Ambulatory Visit: Payer: Self-pay

## 2017-09-21 ENCOUNTER — Emergency Department
Admission: EM | Admit: 2017-09-21 | Discharge: 2017-09-21 | Disposition: A | Discharge status: Home / Self Care | Payer: Self-pay | Attending: Emergency Medicine | Admitting: Emergency Medicine

## 2017-09-21 ENCOUNTER — Encounter: Payer: Self-pay | Admitting: Emergency Medicine

## 2017-09-21 DIAGNOSIS — J449 Chronic obstructive pulmonary disease, unspecified: Secondary | ICD-10-CM | POA: Insufficient documentation

## 2017-09-21 DIAGNOSIS — H6121 Impacted cerumen, right ear: Secondary | ICD-10-CM | POA: Insufficient documentation

## 2017-09-21 DIAGNOSIS — Z8673 Personal history of transient ischemic attack (TIA), and cerebral infarction without residual deficits: Secondary | ICD-10-CM | POA: Insufficient documentation

## 2017-09-21 DIAGNOSIS — F17210 Nicotine dependence, cigarettes, uncomplicated: Secondary | ICD-10-CM | POA: Insufficient documentation

## 2017-09-21 DIAGNOSIS — F419 Anxiety disorder, unspecified: Secondary | ICD-10-CM | POA: Insufficient documentation

## 2017-09-21 DIAGNOSIS — Z79899 Other long term (current) drug therapy: Secondary | ICD-10-CM | POA: Insufficient documentation

## 2017-09-21 NOTE — ED Provider Notes (Signed)
[facility] Emergency Department Provider Note   ____________________________________________   First MD Initiated Contact with Patient 09/21/17 1622     (approximate)  I have reviewed the triage vital signs and the nursing notes.   HISTORY  Chief Complaint Otalgia    HPI Antonio Figueroa is a 42 y.o. male patient complain of right ear pain for 2 days.  Patient states suspected fever.  Patient also states mild hearing loss.  Patient state last week he noticed drainage from the ear but no drainage today.  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  Patient denies URI signs and symptoms.  Patient rates his pain as a 3/10.  Patient described the pain is "achy".  Patient stated the past 2 days he did use over-the-counter eardrops with no relief.  Past Medical History:  Diagnosis Date  . Tobacco abuse     Patient Active Problem List   Diagnosis Date Noted  . Chest pain 12/03/2016  . DENTAL CARIES LIMITED TO ENAMEL 11/17/2007  . THIGH, PAIN 11/05/2007  . ERECTILE DYSFUNCTION 07/15/2007  . ANKLE INJURY, RIGHT 07/15/2007  . TIA 01/27/2007  . 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 ANXIETY DISORDER 06/22/2006  . PANIC DISORDER 06/22/2006  . TOBACCO ABUSE 06/22/2006  . 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 TONSILLECTOMY      Prior to Admission medications   Medication Sig Start Date End Date Taking? Authorizing Provider  ibuprofen (ADVIL,MOTRIN) 600 MG tablet Take 1 tablet (600 mg total) by mouth every 8 (eight) hours as needed for moderate pain. 01/20/17   Evon Slack, PA-C  nicotine (NICODERM CQ - DOSED IN MG/24 HOURS) 21 mg/24hr patch Place 1 patch (21 mg total) onto the skin daily. 12/04/16   Enid Baas, MD  ondansetron (ZOFRAN) 4 MG tablet Take 1 tablet (4 mg total) by mouth daily as needed for nausea or vomiting. 05/07/17 05/07/18  Willy Eddy, MD  ranitidine (ZANTAC) 150 MG tablet Take 1 tablet (150 mg total) by mouth 2 (two) times daily. 05/07/17 05/07/18  Willy Eddy, MD    Allergies Patient has no known allergies.  Family History  Problem Relation Age of Onset  . Diabetes Father   . CVA Father   . Heart disease Paternal Grandmother     Social History Social History   Tobacco Use  . Smoking status: Current Every Day Smoker    Packs/day: 1.00    Years: 30.00    Pack years: 30.00    Types: Cigarettes  . Smokeless tobacco: Never Used  Substance Use Topics  . Alcohol use: No  . Drug use: No    Review of Systems  Constitutional: No fever/chills Eyes: No visual changes. ENT: No sore throat. Cardiovascular: Denies chest pain. Respiratory: Denies shortness of breath. Gastrointestinal: No abdominal pain.  No nausea, no vomiting.  No diarrhea.  No constipation. Genitourinary: Negative for dysuria. Musculoskeletal: Negative for back pain. Skin: Negative for rash. Neurological: Negative for headaches, focal weakness or numbness. Psychiatric:Anxiety and panic disorder  ____________________________________________   PHYSICAL EXAM:  VITAL SIGNS: ED Triage Vitals  Enc Vitals Group     BP 09/21/17 1612 127/80     Pulse Rate 09/21/17 1612 100     Resp 09/21/17 1612 18     Temp 09/21/17 1612 98.2 F (36.8 C)     Temp Source 09/21/17 1612 Oral  SpO2 09/21/17 1612 97 %     Weight 09/21/17 1613 187 lb (84.8 kg)     Height 09/21/17 1613 6' 5.25" (1.962 m)     Head Circumference --      Peak Flow --      Pain Score --      Pain Loc --      Pain Edu? --      Excl. in GC? --    Constitutional: Alert and oriented. Well appearing and in no acute distress. Eyes: Conjunctivae are normal. PERRL. EOMI. Head: Atraumatic. Nose: No congestion/rhinnorhea. EAR: TMs not visible secondary to cerumen impaction.   Mouth/Throat: Mucous membranes are moist.  Oropharynx non-erythematous. Neck: No stridor.     Cardiovascular: Normal rate, regular rhythm. Grossly normal heart sounds.  Good peripheral circulation. Respiratory: Normal respiratory effort.  No retractions. Lungs CTAB. Neurologic:  Normal speech and language. No gross focal neurologic deficits are appreciated. No gait instability. Skin:  Skin is warm, dry and intact. No rash noted. Psychiatric: Mood and affect are normal. Speech and behavior are normal.  ____________________________________________   LABS (all labs ordered are listed, but only abnormal results are displayed)  Labs Reviewed - No data to display ____________________________________________  EKG   ____________________________________________  RADIOLOGY  ED MD interpretation:    Official radiology report(s): No results found.  ____________________________________________   PROCEDURES  Procedure(s) performed: None  Procedures  Critical Care performed: No  ____________________________________________   INITIAL IMPRESSION / ASSESSMENT AND PLAN / ED COURSE  As part of my medical decision making, I reviewed the following data within the electronic MEDICAL RECORD NUMBER    Ear pain and hearing loss secondary to cerebral impaction.  Impaction was removed with irrigation.  Patient given discharge care instructions advised to use eardrops weekly.  [facility]   ____________________________________________   FINAL CLINICAL IMPRESSION(S) / ED DIAGNOSES  Final diagnoses:  Impacted cerumen of right ear     ED Discharge Orders    None       Note:  This document was prepared using Dragon voice recognition software and may include unintentional dictation errors.    Joni ReiningSmith, Rosslyn Pasion K, PA-C 09/21/17 1652    Loleta RoseForbach, Cory, MD 09/21/17 [phone]

## 2017-09-21 NOTE — Discharge Instructions (Signed)
Use eardrops as directed to present build up of earwax.

## 2017-09-21 NOTE — ED Triage Notes (Signed)
Presents with right ear pain   States pain started about 2 days ago  Subjective fever  Also noticed some drainage from ear

## 2017-10-19 ENCOUNTER — Other Ambulatory Visit: Payer: Self-pay

## 2017-10-19 ENCOUNTER — Encounter: Payer: Self-pay | Admitting: Emergency Medicine

## 2017-10-19 ENCOUNTER — Emergency Department
Admission: EM | Admit: 2017-10-19 | Discharge: 2017-10-19 | Disposition: A | Discharge status: Home / Self Care | Payer: Self-pay | Attending: Emergency Medicine | Admitting: Emergency Medicine

## 2017-10-19 DIAGNOSIS — R6889 Other general symptoms and signs: Secondary | ICD-10-CM

## 2017-10-19 DIAGNOSIS — F17210 Nicotine dependence, cigarettes, uncomplicated: Secondary | ICD-10-CM | POA: Insufficient documentation

## 2017-10-19 DIAGNOSIS — J111 Influenza due to unidentified influenza virus with other respiratory manifestations: Secondary | ICD-10-CM | POA: Insufficient documentation

## 2017-10-19 LAB — INFLUENZA PANEL BY PCR (TYPE A & B)
Influenza A By PCR: NEGATIVE
Influenza B By PCR: NEGATIVE

## 2017-10-19 MED ORDER — OSELTAMIVIR PHOSPHATE 75 MG PO CAPS: 75.0000 mg | ORAL_CAPSULE | Freq: Two times a day (BID) | ORAL | 0 refills | Status: DC

## 2017-10-19 NOTE — ED Triage Notes (Signed)
C/O fever and body aches.

## 2017-10-19 NOTE — Discharge Instructions (Signed)
Take the Tamiflu for flu symptoms.  Take Tylenol and ibuprofen as needed for body aches and pain.  Use over-the-counter Mucinex or TheraFlu to help with your cough and congestion.  If you are worsening please return to the emergency department

## 2017-10-19 NOTE — ED Provider Notes (Signed)
[facility] Emergency Department Provider Note  ____________________________________________   First MD Initiated Contact with Patient 10/19/17 1250     (approximate)  I have reviewed the triage vital signs and the nursing notes.   HISTORY  Chief Complaint flu like symptoms    HPI Antonio Figueroa is a 42 y.o. male planes of body aches and fever that started this morning.  He states his temp was 100.7.  States it hurts all the way down into his bones.  He states a coworker had the flu and his girlfriend had flu he is concerned he has flu.  He states he is coughing with some green mucus.  He denies any vomiting or diarrhea.  He denies any chest pain or shortness of breath  Past Medical History:  Diagnosis Date  . Tobacco abuse     Patient Active Problem List   Diagnosis Date Noted  . Chest pain 12/03/2016  . DENTAL CARIES LIMITED TO ENAMEL 11/17/2007  . THIGH, PAIN 11/05/2007  . ERECTILE DYSFUNCTION 07/15/2007  . ANKLE INJURY, RIGHT 07/15/2007  . TIA 01/27/2007  . 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 ANXIETY DISORDER 06/22/2006  . PANIC DISORDER 06/22/2006  . TOBACCO ABUSE 06/22/2006  . 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 TONSILLECTOMY      Prior to Admission medications   Medication Sig Start Date End Date Taking? Authorizing Provider  oseltamivir (TAMIFLU) 75 MG capsule Take 1 capsule (75 mg total) by mouth 2 (two) times daily. 10/19/17   Faythe Ghee, PA-C    Allergies Patient has no known allergies.  Family History  Problem Relation Age of Onset  . Diabetes Father   . CVA Father   . Heart disease Paternal Grandmother     Social History Social History   Tobacco Use  . Smoking status: Current Every Day Smoker    Packs/day: 1.00    Years: 30.00    Pack years: 30.00    Types: Cigarettes  . Smokeless tobacco: Never Used  Substance Use Topics  . Alcohol use: No  . Drug use: No      Review of Systems  Constitutional: Positive fever/chills Eyes: No visual changes. ENT: No sore throat. Respiratory: Positive for cough Gastrointestinal: Denies vomiting or diarrhea Genitourinary: Negative for dysuria. Musculoskeletal: Positive for back pain. Skin: Negative for rash.    ____________________________________________   PHYSICAL EXAM:  VITAL SIGNS: ED Triage Vitals  Enc Vitals Group     BP 10/19/17 1244 114/79     Pulse Rate 10/19/17 1244 91     Resp 10/19/17 1244 16     Temp 10/19/17 1244 98.4 F (36.9 C)     Temp Source 10/19/17 1244 Oral     SpO2 10/19/17 1244 98 %     Weight 10/19/17 1228 187 lb (84.8 kg)     Height 10/19/17 1228 6' 5.25" (1.962 m)     Head Circumference --      Peak Flow --      Pain Score 10/19/17 1228 8     Pain Loc --      Pain Edu? --      Excl. in GC? --     Constitutional: Alert and oriented. Well appearing and in no acute distress. Eyes: Conjunctivae are normal.  Head: Atraumatic. Nose: No congestion/rhinnorhea. Mouth/Throat: Mucous membranes are moist.  Throat is normal. Cardiovascular: Normal rate, regular rhythm.  Heart sounds are normal Respiratory: Normal respiratory  effort.  No retractions, lungs are clear to auscultation.  GU: deferred Musculoskeletal: FROM all extremities, warm and well perfused Neurologic:  Normal speech and language.  Skin:  Skin is warm, dry and intact. No rash noted. Psychiatric: Mood and affect are normal. Speech and behavior are normal.  ____________________________________________   LABS (all labs ordered are listed, but only abnormal results are displayed)  Labs Reviewed  INFLUENZA PANEL BY PCR (TYPE A & B)   ____________________________________________   ____________________________________________  RADIOLOGY    ____________________________________________   PROCEDURES  Procedure(s) performed:  No  Procedures    ____________________________________________   INITIAL IMPRESSION / ASSESSMENT AND PLAN / ED COURSE  Pertinent labs & imaging results that were available during my care of the patient were reviewed by me and considered in my medical decision making (see chart for details).  Patient is 42 year old male complaining of flulike symptoms  On physical exam he appears nontoxic.  Remainder the exam is benign   Flu test is negative  Explained to patient that this is viral.  It could actually be influenza is just not testing positive.  He is requesting Tamiflu.  Prescription of Tamiflu 75 mg twice daily was provided for the patient.  He is to drink plenty of water.  He is to take Tylenol and ibuprofen as needed for fever and pain.  If he is worsening he should see his regular doctor return emergency department.  Patient states he understands will comply with instructions.  He was discharged in stable condition  As part of my medical decision making, I reviewed the following data within the electronic MEDICAL RECORD NUMBER Nursing notes reviewed and incorporated, Labs reviewed influenza test negative, Notes from prior ED visits and [place] Controlled Substance Database  ____________________________________________   FINAL CLINICAL IMPRESSION(S) / ED DIAGNOSES  Final diagnoses:  Flu-like symptoms      NEW MEDICATIONS STARTED DURING THIS VISIT:  Discharge Medication List as of 10/19/2017  2:49 PM    START taking these medications   Details  oseltamivir (TAMIFLU) 75 MG capsule Take 1 capsule (75 mg total) by mouth 2 (two) times daily., Starting Mon 10/19/2017, Print         Note:  This document was prepared using Dragon voice recognition software and may include unintentional dictation errors.    Faythe GheeFisher, Denisha Hoel W, PA-C 10/19/17 1526    Jene EveryKinner, Robert, MD 10/20/17 [phone]

## 2017-10-19 NOTE — ED Notes (Signed)
See triage note  Presents with body aches and fever this am  States temp was 100.7 at home afebrile on arrival

## 2018-01-16 ENCOUNTER — Emergency Department: Payer: Self-pay

## 2018-01-16 ENCOUNTER — Other Ambulatory Visit: Payer: Self-pay

## 2018-01-16 ENCOUNTER — Observation Stay
Admission: EM | Admit: 2018-01-16 | Discharge: 2018-01-17 | Disposition: A | Discharge status: Home / Self Care | Payer: Self-pay | Attending: Internal Medicine | Admitting: Internal Medicine

## 2018-01-16 ENCOUNTER — Encounter: Payer: Self-pay | Admitting: Emergency Medicine

## 2018-01-16 DIAGNOSIS — K029 Dental caries, unspecified: Secondary | ICD-10-CM | POA: Insufficient documentation

## 2018-01-16 DIAGNOSIS — Z823 Family history of stroke: Secondary | ICD-10-CM | POA: Insufficient documentation

## 2018-01-16 DIAGNOSIS — Z8249 Family history of ischemic heart disease and other diseases of the circulatory system: Secondary | ICD-10-CM | POA: Insufficient documentation

## 2018-01-16 DIAGNOSIS — R200 Anesthesia of skin: Secondary | ICD-10-CM | POA: Diagnosis present

## 2018-01-16 DIAGNOSIS — F17210 Nicotine dependence, cigarettes, uncomplicated: Secondary | ICD-10-CM | POA: Insufficient documentation

## 2018-01-16 DIAGNOSIS — Z7982 Long term (current) use of aspirin: Secondary | ICD-10-CM | POA: Insufficient documentation

## 2018-01-16 DIAGNOSIS — G44209 Tension-type headache, unspecified, not intractable: Principal | ICD-10-CM | POA: Insufficient documentation

## 2018-01-16 DIAGNOSIS — I639 Cerebral infarction, unspecified: Secondary | ICD-10-CM

## 2018-01-16 DIAGNOSIS — F410 Panic disorder [episodic paroxysmal anxiety] without agoraphobia: Secondary | ICD-10-CM | POA: Insufficient documentation

## 2018-01-16 DIAGNOSIS — J45909 Unspecified asthma, uncomplicated: Secondary | ICD-10-CM | POA: Insufficient documentation

## 2018-01-16 DIAGNOSIS — Z833 Family history of diabetes mellitus: Secondary | ICD-10-CM | POA: Insufficient documentation

## 2018-01-16 DIAGNOSIS — R202 Paresthesia of skin: Secondary | ICD-10-CM

## 2018-01-16 DIAGNOSIS — F1320 Sedative, hypnotic or anxiolytic dependence, uncomplicated: Secondary | ICD-10-CM | POA: Insufficient documentation

## 2018-01-16 DIAGNOSIS — M50323 Other cervical disc degeneration at C6-C7 level: Secondary | ICD-10-CM | POA: Insufficient documentation

## 2018-01-16 DIAGNOSIS — F419 Anxiety disorder, unspecified: Secondary | ICD-10-CM | POA: Insufficient documentation

## 2018-01-16 HISTORY — DX: Unspecified asthma, uncomplicated: J45.909

## 2018-01-16 LAB — CBC
HCT: 43.5 % (ref 40.0–52.0)
Hemoglobin: 14.8 g/dL (ref 13.0–18.0)
MCH: 33 pg (ref 26.0–34.0)
MCHC: 33.9 g/dL (ref 32.0–36.0)
MCV: 97.2 fL (ref 80.0–100.0)
PLATELETS: 274 10*3/uL (ref 150–440)
RBC: 4.47 MIL/uL (ref 4.40–5.90)
RDW: 13.7 % (ref 11.5–14.5)
WBC: 7.5 10*3/uL (ref 3.8–10.6)

## 2018-01-16 LAB — DIFFERENTIAL
BASOS ABS: 0.1 10*3/uL (ref 0–0.1)
BASOS PCT: 1 %
EOS PCT: 1 %
Eosinophils Absolute: 0.1 10*3/uL (ref 0–0.7)
LYMPHS ABS: 1.7 10*3/uL (ref 1.0–3.6)
Lymphocytes Relative: 23 %
MONOS PCT: 7 %
Monocytes Absolute: 0.5 10*3/uL (ref 0.2–1.0)
NEUTROS PCT: 68 %
Neutro Abs: 5.1 10*3/uL (ref 1.4–6.5)

## 2018-01-16 LAB — COMPREHENSIVE METABOLIC PANEL
ALT: 12 U/L — ABNORMAL LOW (ref 17–63)
ANION GAP: 8 (ref 5–15)
AST: 22 U/L (ref 15–41)
Albumin: 4.2 g/dL (ref 3.5–5.0)
Alkaline Phosphatase: 59 U/L (ref 38–126)
BILIRUBIN TOTAL: 0.6 mg/dL (ref 0.3–1.2)
BUN: 13 mg/dL (ref 6–20)
CHLORIDE: 105 mmol/L (ref 101–111)
CO2: 26 mmol/L (ref 22–32)
Calcium: 8.8 mg/dL — ABNORMAL LOW (ref 8.9–10.3)
Creatinine, Ser: 1.1 mg/dL (ref 0.61–1.24)
GFR calc Af Amer: >60 mL/min (ref >60)
Glucose, Bld: 111 mg/dL — ABNORMAL HIGH (ref 65–99)
Potassium: 3.8 mmol/L (ref 3.5–5.1)
Sodium: 139 mmol/L (ref 135–145)
TOTAL PROTEIN: 7.1 g/dL (ref 6.5–8.1)

## 2018-01-16 LAB — PROTIME-INR
INR: 0.99
Prothrombin Time: 13 seconds (ref 11.4–15.2)

## 2018-01-16 LAB — APTT: aPTT: 32 seconds (ref 24–36)

## 2018-01-16 LAB — GLUCOSE, CAPILLARY: GLUCOSE-CAPILLARY: 128 mg/dL — AB (ref 65–99)

## 2018-01-16 LAB — TROPONIN I

## 2018-01-16 MED ORDER — HEPARIN SODIUM (PORCINE) 5000 UNIT/ML IJ SOLN
5000.0000 [IU] | Freq: Three times a day (TID) | INTRAMUSCULAR | Status: DC
  Filled 2018-01-16 (×2): qty 1

## 2018-01-16 MED ORDER — ASPIRIN EC 325 MG PO TBEC
325.0000 mg | DELAYED_RELEASE_TABLET | Freq: Every day | ORAL | Status: DC
  Administered 2018-01-17: 09:00:00 325 mg via ORAL
  Filled 2018-01-16: qty 1

## 2018-01-16 MED ORDER — IOHEXOL 300 MG/ML  SOLN
75.0000 mL | Freq: Once | INTRAMUSCULAR | Status: AC | PRN
  Administered 2018-01-16: 75 mL via INTRAVENOUS

## 2018-01-16 MED ORDER — SODIUM CHLORIDE 0.9 % IV SOLN
Freq: Once | INTRAVENOUS | Status: AC
  Administered 2018-01-16: via INTRAVENOUS

## 2018-01-16 MED ORDER — NICOTINE 14 MG/24HR TD PT24
14.0000 mg | MEDICATED_PATCH | Freq: Once | TRANSDERMAL | Status: AC
  Administered 2018-01-16 (×2): 14 mg via TRANSDERMAL

## 2018-01-16 MED ORDER — NICOTINE 14 MG/24HR TD PT24
MEDICATED_PATCH | TRANSDERMAL | Status: AC
  Administered 2018-01-16: 14 mg via TRANSDERMAL
  Filled 2018-01-16: qty 1

## 2018-01-16 MED ORDER — ACETAMINOPHEN 650 MG RE SUPP: 650.0000 mg | RECTAL | Status: DC | PRN

## 2018-01-16 MED ORDER — SENNOSIDES-DOCUSATE SODIUM 8.6-50 MG PO TABS: 1.0000 | ORAL_TABLET | Freq: Every evening | ORAL | Status: DC | PRN

## 2018-01-16 MED ORDER — STROKE: EARLY STAGES OF RECOVERY BOOK
Freq: Once | Status: AC
  Administered 2018-01-16

## 2018-01-16 MED ORDER — PNEUMOCOCCAL VAC POLYVALENT 25 MCG/0.5ML IJ INJ
0.5000 mL | INJECTION | INTRAMUSCULAR | Status: DC
  Filled 2018-01-16: qty 0.5

## 2018-01-16 MED ORDER — ACETAMINOPHEN 160 MG/5ML PO SOLN
650.0000 mg | ORAL | Status: DC | PRN
  Filled 2018-01-16: qty 20.3

## 2018-01-16 MED ORDER — ASPIRIN 81 MG PO CHEW
324.0000 mg | CHEWABLE_TABLET | Freq: Once | ORAL | Status: AC
  Administered 2018-01-16: 324 mg via ORAL
  Filled 2018-01-16: qty 4

## 2018-01-16 MED ORDER — ACETAMINOPHEN 325 MG PO TABS
650.0000 mg | ORAL_TABLET | ORAL | Status: DC | PRN
  Administered 2018-01-17: 650 mg via ORAL
  Filled 2018-01-16: qty 2

## 2018-01-16 NOTE — H&P (Signed)
[facility] - [place] at [facility]   PATIENT NAME: Antonio Figueroa    MR#:  [id number]  DATE OF BIRTH:  June 12, 1976  DATE OF ADMISSION:  01/16/2018  PRIMARY CARE PHYSICIAN: Patient, No Pcp Per   REQUESTING/REFERRING PHYSICIAN:   CHIEF COMPLAINT:   Chief Complaint  Patient presents with  . Headache    HISTORY OF PRESENT ILLNESS: Antonio Figueroa  is a 42 y.o. male with a known history of asthma and tobacco abuse. Patient presented to emergency room for acute onset of severe headache, associated with right eye blurry vision, numbness and tingling of the right face and its right upper and lower extremities.  His symptoms started approximately 45 minutes before presenting to emergency room, while wrestling with his wife in the parking lot.  The headache resolved while in the emergency room and the other symptoms improved, as well, on their own.  Patient denies any similar episodes in the past. Blood test done emergency room including CBC and CMP are unremarkable. Brain CT and CT angiogram of the head and neck, reviewed by myself, are noted without significant vascular disease or stenosis. She was evaluated by tele-neurology and advised to remain for observation and further stroke work-up.   PAST MEDICAL HISTORY:   Past Medical History:  Diagnosis Date  . Asthma   . Tobacco abuse     PAST SURGICAL HISTORY:  Past Surgical History:  Procedure Laterality Date  . TONSILLECTOMY      SOCIAL HISTORY:  Social History   Tobacco Use  . Smoking status: Current Every Day Smoker    Packs/day: 1.00    Years: 30.00    Pack years: 30.00    Types: Cigarettes  . Smokeless tobacco: Never Used  Substance Use Topics  . Alcohol use: No    FAMILY HISTORY:  Family History  Problem Relation Age of Onset  . Diabetes Father   . CVA Father   . Heart disease Paternal Grandmother     DRUG ALLERGIES: No Known Allergies  REVIEW OF SYSTEMS:   CONSTITUTIONAL: No fever,  fatigue or weakness.  EYES: No blurred or double vision.  EARS, NOSE, AND THROAT: No tinnitus or ear pain.  RESPIRATORY: No cough, shortness of breath, wheezing or hemoptysis.  CARDIOVASCULAR: No chest pain, orthopnea, edema.  GASTROINTESTINAL: No nausea, vomiting, diarrhea or abdominal pain.  GENITOURINARY: No dysuria, hematuria.  ENDOCRINE: No polyuria, nocturia,  HEMATOLOGY: No anemia, easy bruising or bleeding SKIN: No rash or lesion. MUSCULOSKELETAL: No joint pain or arthritis.   NEUROLOGIC: Right facial and right upper extremity tingling and numbness, no weakness.  PSYCHIATRY: No anxiety or depression.   MEDICATIONS AT HOME:  Prior to Admission medications   Medication Sig Start Date End Date Taking? Authorizing Provider  oseltamivir (TAMIFLU) 75 MG capsule Take 1 capsule (75 mg total) by mouth 2 (two) times daily. Patient not taking: Reported on 01/16/2018 10/19/17   Faythe Ghee, PA-C      PHYSICAL EXAMINATION:   VITAL SIGNS: Blood pressure (!) 135/97, pulse 72, temperature 98.4 F (36.9 C), resp. rate 14, height 6\' 4"  (1.93 m), weight 81.2 kg (179 lb), SpO2 97 %.  GENERAL:  42 y.o.-year-old patient lying in the bed with no acute distress.  EYES: Pupils equal, round, reactive to light and accommodation. No scleral icterus. Extraocular muscles intact.  HEENT: Head atraumatic, normocephalic. Oropharynx and nasopharynx clear.  NECK:  Supple, no jugular venous distention. No thyroid enlargement, no tenderness.  LUNGS: Normal breath sounds bilaterally,  no wheezing, rales,rhonchi or crepitation. No use of accessory muscles of respiration.  CARDIOVASCULAR: S1, S2 normal. No murmurs, rubs, or gallops.  ABDOMEN: Soft, nontender, nondistended. Bowel sounds present. No organomegaly or mass.  EXTREMITIES: No pedal edema, cyanosis, or clubbing.  NEUROLOGIC: Normal speech and language.  Cranial nerves II through XII are intact. Muscle strength 5/5 in all extremities.  Slightly diminished  sensation at the right side of the face and right upper extremity. Gait is stable.  PSYCHIATRIC: The patient is alert and oriented x 3.  SKIN: No obvious rash, lesion, or ulcer.   LABORATORY PANEL:   CBC Recent Labs  Lab 01/16/18 1722  WBC 7.5  HGB 14.8  HCT 43.5  PLT 274  MCV 97.2  MCH 33.0  MCHC 33.9  RDW 13.7  LYMPHSABS 1.7  MONOABS 0.5  EOSABS 0.1  BASOSABS 0.1   ------------------------------------------------------------------------------------------------------------------  Chemistries  Recent Labs  Lab 01/16/18 1722  NA 139  K 3.8  CL 105  CO2 26  GLUCOSE 111*  BUN 13  CREATININE 1.10  CALCIUM 8.8*  AST 22  ALT 12*  ALKPHOS 59  BILITOT 0.6   ------------------------------------------------------------------------------------------------------------------ estimated creatinine clearance is 98.4 mL/min (by C-G formula based on SCr of 1.1 mg/dL). ------------------------------------------------------------------------------------------------------------------ No results for input(s): TSH, T4TOTAL, T3FREE, THYROIDAB in the last 72 hours.  Invalid input(s): FREET3   Coagulation profile Recent Labs  Lab 01/16/18 1722  INR 0.99   ------------------------------------------------------------------------------------------------------------------- No results for input(s): DDIMER in the last 72 hours. -------------------------------------------------------------------------------------------------------------------  Cardiac Enzymes Recent Labs  Lab 01/16/18 1722  TROPONINI <0.03   ------------------------------------------------------------------------------------------------------------------ Invalid input(s): POCBNP  ---------------------------------------------------------------------------------------------------------------  Urinalysis    Component Value Date/Time   COLORURINE STRAW (A) 12/03/2016 1955   APPEARANCEUR CLEAR (A) 12/03/2016 1955    LABSPEC 1.005 12/03/2016 1955   PHURINE 6.0 12/03/2016 1955   GLUCOSEU NEGATIVE 12/03/2016 1955   HGBUR NEGATIVE 12/03/2016 1955   BILIRUBINUR NEGATIVE 12/03/2016 1955   KETONESUR NEGATIVE 12/03/2016 1955   PROTEINUR NEGATIVE 12/03/2016 1955   NITRITE NEGATIVE 12/03/2016 1955   LEUKOCYTESUR NEGATIVE 12/03/2016 1955     RADIOLOGY: Ct Angio Head W Or Wo Contrast  Result Date: 01/16/2018 CLINICAL DATA:  Code stroke. Headache. Blurred vision in the right eye. The headache is improving. EXAM: CT ANGIOGRAPHY HEAD AND NECK TECHNIQUE: Multidetector CT imaging of the head and neck was performed using the standard protocol during bolus administration of intravenous contrast. Multiplanar CT image reconstructions and MIPs were obtained to evaluate the vascular anatomy. Carotid stenosis measurements (when applicable) are obtained utilizing NASCET criteria, using the distal internal carotid diameter as the denominator. CONTRAST:  75mL OMNIPAQUE IOHEXOL 300 MG/ML  SOLN COMPARISON:  None. FINDINGS: CTA NECK FINDINGS Aortic arch: A 3 vessel arch configuration is present. There is no focal stenosis or atherosclerotic disease. Right carotid system: The right common carotid artery is within normal limits. The right carotid bifurcation is normal. The cervical right ICA is within normal limits. Left carotid system: The left common carotid artery is within normal limits. Bifurcation is unremarkable. The cervical left ICA is normal. Vertebral arteries: The left vertebral artery is slightly dominant to the right. Both vertebral arteries originate from the subclavian arteries without significant stenosis. The right PICA origin is below the dural margin. There is no significant stenosis of either vertebral artery in the neck. Skeleton: Vertebral body heights alignment are maintained. Endplate changes and uncovertebral spurring results and foraminal narrowing bilaterally at C6-7, left greater than right. Other neck: No  significant mucosal  or submucosal lesions are present. The thyroid is within normal limits. There is no significant cervical adenopathy. Salivary glands are within normal limits bilaterally. Upper chest: Centrilobular emphysematous changes are present at the lung apices. There is scarring at the lung apices. A 6 mm left apical nodule is present. This may be postinflammatory. There is some retraction of the hila bilaterally. Paraseptal emphysematous changes are noted as well. Review of the MIP images confirms the above findings CTA HEAD FINDINGS Anterior circulation: The internal carotid arteries are within normal limits through the ICA termini bilaterally. The A1 and M1 segments are normal. The anterior communicating artery is patent. MCA bifurcations are intact. The ACA and MCA branch vessels are within normal limits. Posterior circulation: The left vertebral artery is the dominant vessel of the dural margin. The left PICA origin is visualized and normal. Vertebrobasilar junction is normal. Basilar artery is somewhat small. Both posterior cerebral arteries are fed from P1 segments and posterior communicating arteries. PCA branch vessels are within normal limits bilaterally. Venous sinuses: The dural sinuses are patent. Straight sinus and deep cerebral veins are intact. Cortical veins are unremarkable. Anatomic variants: None. Delayed phase: The postcontrast images demonstrate no pathologic enhancement. Review of the MIP images confirms the above findings IMPRESSION: 1. No significant vascular disease or stenosis in the neck. 2. Normal variant CTA circle of Willis without significant proximal stenosis, aneurysm, or branch vessel occlusion. 3. 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 This could impact the C7 nerve roots. Electronically Signed   By: Marin Roberts M.D.   On: 01/16/2018 20:08   Ct Angio Neck W And/or Wo  Contrast  Result Date: 01/16/2018 CLINICAL DATA:  Code stroke. Headache. Blurred vision in the right eye. The headache is improving. EXAM: CT ANGIOGRAPHY HEAD AND NECK TECHNIQUE: Multidetector CT imaging of the head and neck was performed using the standard protocol during bolus administration of intravenous contrast. Multiplanar CT image reconstructions and MIPs were obtained to evaluate the vascular anatomy. Carotid stenosis measurements (when applicable) are obtained utilizing NASCET criteria, using the distal internal carotid diameter as the denominator. CONTRAST:  75mL OMNIPAQUE IOHEXOL 300 MG/ML  SOLN COMPARISON:  None. FINDINGS: CTA NECK FINDINGS Aortic arch: A 3 vessel arch configuration is present. There is no focal stenosis or atherosclerotic disease. Right carotid system: The right common carotid artery is within normal limits. The right carotid bifurcation is normal. The cervical right ICA is within normal limits. Left carotid system: The left common carotid artery is within normal limits. Bifurcation is unremarkable. The cervical left ICA is normal. Vertebral arteries: The left vertebral artery is slightly dominant to the right. Both vertebral arteries originate from the subclavian arteries without significant stenosis. The right PICA origin is below the dural margin. There is no significant stenosis of either vertebral artery in the neck. Skeleton: Vertebral body heights alignment are maintained. Endplate changes and uncovertebral spurring results and foraminal narrowing bilaterally at C6-7, left greater than right. Other neck: No significant mucosal or submucosal lesions are present. The thyroid is within normal limits. There is no significant cervical adenopathy. Salivary glands are within normal limits bilaterally. Upper chest: Centrilobular emphysematous changes are present at the lung apices. There is scarring at the lung apices. A 6 mm left apical nodule is present. This may be postinflammatory.  There is some retraction of the hila bilaterally. Paraseptal emphysematous changes are noted as well. Review of the MIP images confirms the  above findings CTA HEAD FINDINGS Anterior circulation: The internal carotid arteries are within normal limits through the ICA termini bilaterally. The A1 and M1 segments are normal. The anterior communicating artery is patent. MCA bifurcations are intact. The ACA and MCA branch vessels are within normal limits. Posterior circulation: The left vertebral artery is the dominant vessel of the dural margin. The left PICA origin is visualized and normal. Vertebrobasilar junction is normal. Basilar artery is somewhat small. Both posterior cerebral arteries are fed from P1 segments and posterior communicating arteries. PCA branch vessels are within normal limits bilaterally. Venous sinuses: The dural sinuses are patent. Straight sinus and deep cerebral veins are intact. Cortical veins are unremarkable. Anatomic variants: None. Delayed phase: The postcontrast images demonstrate no pathologic enhancement. Review of the MIP images confirms the above findings IMPRESSION: 1. No significant vascular disease or stenosis in the neck. 2. Normal variant CTA circle of Willis without significant proximal stenosis, aneurysm, or branch vessel occlusion. 3. 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 This could impact the C7 nerve roots. Electronically Signed   By: Marin Robertshristopher  Mattern M.D.   On: 01/16/2018 20:08   Ct Head Code Stroke Wo Contrast  Result Date: 01/16/2018 CLINICAL DATA:  Code stroke. Acute onset of headache and right eye blurred vision. Persistent blurred vision and tingling in the right arm. Headache is improving. EXAM: CT HEAD WITHOUT CONTRAST TECHNIQUE: Contiguous axial images were obtained from the base of the skull through the vertex without intravenous contrast. COMPARISON:  CT head  without contrast 07/22/2017 FINDINGS: Brain: No acute infarct, hemorrhage, or mass lesion is present. The ventricles are of normal size. No significant white matter disease is present. No significant extra-axial fluid collection is present. The brainstem and cerebellum are normal. Vascular: No hyperdense vessel or unexpected calcification. Skull: Calvarium is intact. No focal lytic or blastic lesions are present. No significant extracranial soft tissue lesions are present. Sinuses/Orbits: The paranasal sinuses and mastoid air cells are clear. Globes and orbits are within normal limits. ASPECTS [facility]([place] Stroke Program Early CT Score) - Ganglionic level infarction (caudate, lentiform nuclei, internal capsule, insula, M1-M3 cortex): 7/7 - Supraganglionic infarction (M4-M6 cortex): 3/3 Total score (0-10 with 10 being normal): 10/10 IMPRESSION: 1. Negative CT of the head 2. ASPECTS is 10/10 These results were called by telephone at the time of interpretation on 01/16/2018 at 5:20 pm to Dr. Dorothea GlassmanPAUL MALINDA , who verbally acknowledged these results. Electronically Signed   By: Marin Robertshristopher  Mattern M.D.   On: 01/16/2018 17:22    EKG: Orders placed or performed during the hospital encounter of 01/16/18  . EKG 12-Lead  . EKG 12-Lead  . ED EKG  . ED EKG    IMPRESSION AND PLAN:  1.  Right facial numbness. 2.  Right upper extremity tingling and numbness. 3.  Right eye blurry vision. 4.  Tobacco abuse.  Patient symptoms could be related to an anxiety attack, or related to migraine headache episode.  Will rule out stroke/TIA.  We will check MRI of the brain, 2D echo.  We will start patient on aspirin.  Will check A1c and cholesterol level.  Check B12 level.  Continue to monitor clinically closely with frequent neuro checks. Neurologist consulted for further evaluation and treatment. Smoking cessation was discussed with patient in detail.  All the records are reviewed and case discussed with ED  provider. Management plans discussed with the patient and his in agreement.  CODE  STATUS: Code Status History    Date Active Date Inactive Code Status Order ID Comments User Context   12/03/2016 1906 12/04/2016 2144 Full Code [id number]  Houston Siren, MD Inpatient       TOTAL TIME TAKING CARE OF THIS PATIENT: 45 minutes.    Cammy Copa M.D on 01/16/2018 at 9:46 PM  Between 7am to 6pm - Pager - [phone]  After 6pm go to [email or web address] - password [email or web address]  [facility]  Office  [phone]  CC: Primary care physician; Patient, No Pcp Per

## 2018-01-16 NOTE — ED Triage Notes (Signed)
States approx 45 min ago had onset headache and R eye blurry. States headache decreasing, R eye still blurry, R arm tingling. Smile symmetrical, grips equal and strong, leg strength equal.

## 2018-01-16 NOTE — Consult Note (Signed)
[facility]rology Consult Services    Date of Service:      01/16/2018 17:45:04  Impression:  1. Facial numbness 2. RO Acute ischemic Stroke   The patient is a pleasant 42 year old man who states that he developed acute onset of right-sided facial numbness about 45 minutes before presentation to the emergency department, after wrestling with his wife in a parking lot. At the current time he continues had persistent numbness and paresthesias in the right face and reports on examination that this extends to the right upper and lower extremities.  CT scan of head was unremarkable. CT angiogram of the head and neck was done and there was no indication of dissection or large vessel occlusion on this.   At the current time patient has minimal symptoms that are nondisabling and so the patient is not considered to be a candidate for TPA thrombolytics. Metrics:      Start Time: 01/16/2018 17:43:50  Stamp Time: 01/16/2018 17:45:04  Time First Login Attempt: 01/16/2018 17:48:00  Video Start Time: 01/16/2018 17:48:00  Symptoms:  paresthesias and numbness right face and tingling paresthesias around the mouth, as well as blurring of vision on the right side in a monocular fashion.  tPA Candidate: No  CT Head:    Reviewed  Advanced Imaging:    Reviewed   CTA head and neck obtained   CTP obtained  Neuro Interventional Case:  No Discussed with Neurointerventionalist?:  No ER physician notified of the decision on thrombolytics management: Yes ER physician recommended to consult neurointerventionalist physician if the advanced imaging suggestive of Large Vessel Occlusive Thrombotic Disease: No    Recommendations:    . Patient is not considered to be candidate for TPA thrombolytics. . There is no indication of large vessel occlusive disease and so the patient is not a candidate for thrombectomy. . Patient is to be admitted for further stroke workup. Marland Kitchen. Antiplatelet therapy can be  initiated. . In-house neurology to see the patient routinely. Discussed with ED physician   DR Derenda MisNima Celes Dedic  [facility]     ------------------------------------------------------------------------------     History of Present Illness   Patient is a 42 year old  who presents with symptoms of paresthesias and numbness right face and tingling paresthesias around the mouth, as well as blurring of vision on the right side in a monocular fashion.  Last Seen Normal was within 4.5 hours of presentation There is no history of hemorrhagic complications or intracranial hemorrhage There is no history of Recent Anticoagulants There is no history of recent major surgery There is no history of recent stroke   Examination    Assessments 1A: Level of Consciousness - Alert; keenly responsive +0 1B: Ask Month and Age - Both Questions Right +0 1C: Blink Eyes & Squeeze Hands - Performs Both Tasks +0 2: Test Horizontal Extraocular Movements - Normal +0 3: Test Visual Fields - No Visual Loss +0 4: Test Facial Palsy (Use Grimace if Obtunded) - Normal symmetry +0 5A: Test Left Arm Motor Drift - No Drift for 10 Seconds +0 5B: Test Right Arm Motor Drift - No Drift for 10 Seconds +0 6A: Test Left Leg Motor Drift - No Drift for 5 Seconds +0 6B: Test Right Leg Motor Drift - No Drift for 5 Seconds +0 7: Test Limb Ataxia (FNF/Heel-Shin) - No Ataxia +0 8: Test Sensation - Mild-Moderate Loss: Less Sharp/More Dull +1 9: Test Language/Aphasia - Normal; No aphasia +0 10: Test Dysarthria - Normal +0 11: Test Extinction/Inattention -  No abnormality +0 NIHSS 1

## 2018-01-16 NOTE — Progress Notes (Signed)
Chaplain responded to a code stroke. Pt was alert and talking to care team. Family was in the waiting area. Chaplain escorted mother and father to consultation room and then to the room. Chaplain praed for Pt and care team.    01/16/18 1700  Clinical Encounter Type  Visited With Patient;Family  Visit Type Initial;Code  Referral From Care management  Spiritual Encounters  Spiritual Needs Prayer;Emotional

## 2018-01-16 NOTE — ED Provider Notes (Signed)
[facility] Emergency Department Provider Note   ____________________________________________   First MD Initiated Contact with Patient 01/16/18 1712     (approximate)  I have reviewed the triage vital signs and the nursing notes.   HISTORY  Chief Complaint Headache   HPI Antonio Figueroa is a 42 y.o. male Who reports he had sudden onset of a severe headache about 45 minutes prior to arrival. His right eye vision became blurry in his right arm began to tingle and it was difficult for him to use it what he usually does it didn't feel right. Symptoms are improving including a headache. CT is negative. We called a code stroke when he  arrived still waiting for neurologyto do the computerized interview.   Past Medical History:  Diagnosis Date  . Asthma   . Tobacco abuse     Patient Active Problem List   Diagnosis Date Noted  . Chest pain 12/03/2016  . DENTAL CARIES LIMITED TO ENAMEL 11/17/2007  . THIGH, PAIN 11/05/2007  . ERECTILE DYSFUNCTION 07/15/2007  . ANKLE INJURY, RIGHT 07/15/2007  . TIA 01/27/2007  . 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 ANXIETY DISORDER 06/22/2006  . PANIC DISORDER 06/22/2006  . TOBACCO ABUSE 06/22/2006  . 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 TONSILLECTOMY      Prior to Admission medications   Medication Sig Start Date End Date Taking? Authorizing Provider  oseltamivir (TAMIFLU) 75 MG capsule Take 1 capsule (75 mg total) by mouth 2 (two) times daily. Patient not taking: Reported on 01/16/2018 10/19/17   Faythe Ghee, PA-C    Allergies Patient has no known allergies.  Family History  Problem Relation Age of Onset  . Diabetes Father   . CVA Father   . Heart disease Paternal Grandmother     Social History Social History   Tobacco Use  . Smoking status: Current Every Day Smoker    Packs/day: 1.00    Years: 30.00    Pack years: 30.00    Types: Cigarettes  .  Smokeless tobacco: Never Used  Substance Use Topics  . Alcohol use: No  . Drug use: No    Review of Systems  Constitutional: No fever/chills Eyes:  see history of present illness ENT: No sore throat. Cardiovascular: Denies chest pain. Respiratory: Denies shortness of breath. Gastrointestinal: No abdominal pain.  No nausea, no vomiting.  No diarrhea.  No constipation. Genitourinary: Negative for dysuria. Musculoskeletal: Negative for back pain. Skin: Negative for rash. Neurological:see history of present illness ____________________________________________   PHYSICAL EXAM:  VITAL SIGNS: ED Triage Vitals  Enc Vitals Group     BP 01/16/18 1653 127/74     Pulse Rate 01/16/18 1653 98     Resp 01/16/18 1653 18     Temp 01/16/18 1653 98.4 F (36.9 C)     Temp Source 01/16/18 1653 Oral     SpO2 01/16/18 1653 99 %     Weight 01/16/18 1658 179 lb (81.2 kg)     Height 01/16/18 1658 6\' 4"  (1.93 m)     Head Circumference --      Peak Flow --      Pain Score 01/16/18 1658 5     Pain Loc --      Pain Edu? --      Excl. in GC? --     Constitutional: Alert and oriented. Well appearing and in no acute distress. Eyes: Conjunctivae are normal. PERRL. EOMI.  Head: Atraumatic. Nose: No congestion/rhinnorhea. Mouth/Throat: Mucous membranes are moist.  Oropharynx non-erythematous. Neck: No stridor.  Cardiovascular: Normal rate, regular rhythm. Grossly normal heart sounds.  Good peripheral circulation. Respiratory: Normal respiratory effort.  No retractions. Lungs CTAB. Gastrointestinal: Soft and nontender. No distention. No abdominal bruits. No CVA tenderness. Musculoskeletal: No lower extremity tenderness nor edema.  No joint effusions. Neurologic:  Normal speech and language. cranial nerves II through XII are intact. Visual fields were not checked there. Visual acuity was not checked either just vision in general. Patient does say his right eye is still somewhat blurry. Cerebellar  finger to nose and rapid alternating movements is minimally slowed on the right. Motor strength is 5 over 5 throughout patient started complaining of some right arm numbness legs feel normal. Heel-to-shin is normal. Skin:  Skin is warm, dry and intact. No rash noted. Psychiatric: Mood and affect are normal. Speech and behavior are normal.  ____________________________________________   LABS (all labs ordered are listed, but only abnormal results are displayed)  Labs Reviewed  COMPREHENSIVE METABOLIC PANEL - Abnormal; Notable for the following components:      Result Value   Glucose, Bld 111 (*)    Calcium 8.8 (*)    ALT 12 (*)    All other components within normal limits  GLUCOSE, CAPILLARY - Abnormal; Notable for the following components:   Glucose-Capillary 128 (*)    All other components within normal limits  PROTIME-INR  APTT  CBC  DIFFERENTIAL  TROPONIN I  CBG MONITORING, ED   ____________________________________________  EKG  EKG read and interpreted by me shows normal sinus rhythm rate of 97 normal axis no acute ST-T wave changes ____________________________________________  RADIOLOGY  ED MD interpretation:  radiology calls reports head CT is negative. CT and shows no acute disease  Official radiology report(s): Ct Angio Head W Or Wo Contrast  Result Date: 01/16/2018 CLINICAL DATA:  Code stroke. Headache. Blurred vision in the right eye. The headache is improving. EXAM: CT ANGIOGRAPHY HEAD AND NECK TECHNIQUE: Multidetector CT imaging of the head and neck was performed using the standard protocol during bolus administration of intravenous contrast. Multiplanar CT image reconstructions and MIPs were obtained to evaluate the vascular anatomy. Carotid stenosis measurements (when applicable) are obtained utilizing NASCET criteria, using the distal internal carotid diameter as the denominator. CONTRAST:  75mL OMNIPAQUE IOHEXOL 300 MG/ML  SOLN COMPARISON:  None. FINDINGS: CTA  NECK FINDINGS Aortic arch: A 3 vessel arch configuration is present. There is no focal stenosis or atherosclerotic disease. Right carotid system: The right common carotid artery is within normal limits. The right carotid bifurcation is normal. The cervical right ICA is within normal limits. Left carotid system: The left common carotid artery is within normal limits. Bifurcation is unremarkable. The cervical left ICA is normal. Vertebral arteries: The left vertebral artery is slightly dominant to the right. Both vertebral arteries originate from the subclavian arteries without significant stenosis. The right PICA origin is below the dural margin. There is no significant stenosis of either vertebral artery in the neck. Skeleton: Vertebral body heights alignment are maintained. Endplate changes and uncovertebral spurring results and foraminal narrowing bilaterally at C6-7, left greater than right. Other neck: No significant mucosal or submucosal lesions are present. The thyroid is within normal limits. There is no significant cervical adenopathy. Salivary glands are within normal limits bilaterally. Upper chest: Centrilobular emphysematous changes are present at the lung apices. There is scarring at the lung apices. A 6 mm left apical nodule  is present. This may be postinflammatory. There is some retraction of the hila bilaterally. Paraseptal emphysematous changes are noted as well. Review of the MIP images confirms the above findings CTA HEAD FINDINGS Anterior circulation: The internal carotid arteries are within normal limits through the ICA termini bilaterally. The A1 and M1 segments are normal. The anterior communicating artery is patent. MCA bifurcations are intact. The ACA and MCA branch vessels are within normal limits. Posterior circulation: The left vertebral artery is the dominant vessel of the dural margin. The left PICA origin is visualized and normal. Vertebrobasilar junction is normal. Basilar artery is  somewhat small. Both posterior cerebral arteries are fed from P1 segments and posterior communicating arteries. PCA branch vessels are within normal limits bilaterally. Venous sinuses: The dural sinuses are patent. Straight sinus and deep cerebral veins are intact. Cortical veins are unremarkable. Anatomic variants: None. Delayed phase: The postcontrast images demonstrate no pathologic enhancement. Review of the MIP images confirms the above findings IMPRESSION: 1. No significant vascular disease or stenosis in the neck. 2. Normal variant CTA circle of Willis without significant proximal stenosis, aneurysm, or branch vessel occlusion. 3. 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 This could impact the C7 nerve roots. Electronically Signed   By: Marin Roberts M.D.   On: 01/16/2018 20:08   Ct Angio Neck W And/or Wo Contrast  Result Date: 01/16/2018 CLINICAL DATA:  Code stroke. Headache. Blurred vision in the right eye. The headache is improving. EXAM: CT ANGIOGRAPHY HEAD AND NECK TECHNIQUE: Multidetector CT imaging of the head and neck was performed using the standard protocol during bolus administration of intravenous contrast. Multiplanar CT image reconstructions and MIPs were obtained to evaluate the vascular anatomy. Carotid stenosis measurements (when applicable) are obtained utilizing NASCET criteria, using the distal internal carotid diameter as the denominator. CONTRAST:  75mL OMNIPAQUE IOHEXOL 300 MG/ML  SOLN COMPARISON:  None. FINDINGS: CTA NECK FINDINGS Aortic arch: A 3 vessel arch configuration is present. There is no focal stenosis or atherosclerotic disease. Right carotid system: The right common carotid artery is within normal limits. The right carotid bifurcation is normal. The cervical right ICA is within normal limits. Left carotid system: The left common carotid artery is within normal limits.  Bifurcation is unremarkable. The cervical left ICA is normal. Vertebral arteries: The left vertebral artery is slightly dominant to the right. Both vertebral arteries originate from the subclavian arteries without significant stenosis. The right PICA origin is below the dural margin. There is no significant stenosis of either vertebral artery in the neck. Skeleton: Vertebral body heights alignment are maintained. Endplate changes and uncovertebral spurring results and foraminal narrowing bilaterally at C6-7, left greater than right. Other neck: No significant mucosal or submucosal lesions are present. The thyroid is within normal limits. There is no significant cervical adenopathy. Salivary glands are within normal limits bilaterally. Upper chest: Centrilobular emphysematous changes are present at the lung apices. There is scarring at the lung apices. A 6 mm left apical nodule is present. This may be postinflammatory. There is some retraction of the hila bilaterally. Paraseptal emphysematous changes are noted as well. Review of the MIP images confirms the above findings CTA HEAD FINDINGS Anterior circulation: The internal carotid arteries are within normal limits through the ICA termini bilaterally. The A1 and M1 segments are normal. The anterior communicating artery is patent. MCA bifurcations are intact. The ACA and MCA branch vessels are within normal limits. Posterior  circulation: The left vertebral artery is the dominant vessel of the dural margin. The left PICA origin is visualized and normal. Vertebrobasilar junction is normal. Basilar artery is somewhat small. Both posterior cerebral arteries are fed from P1 segments and posterior communicating arteries. PCA branch vessels are within normal limits bilaterally. Venous sinuses: The dural sinuses are patent. Straight sinus and deep cerebral veins are intact. Cortical veins are unremarkable. Anatomic variants: None. Delayed phase: The postcontrast images  demonstrate no pathologic enhancement. Review of the MIP images confirms the above findings IMPRESSION: 1. No significant vascular disease or stenosis in the neck. 2. Normal variant CTA circle of Willis without significant proximal stenosis, aneurysm, or branch vessel occlusion. 3. 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 This could impact the C7 nerve roots. Electronically Signed   By: Marin Robertshristopher  Mattern M.D.   On: 01/16/2018 20:08   Ct Head Code Stroke Wo Contrast  Result Date: 01/16/2018 CLINICAL DATA:  Code stroke. Acute onset of headache and right eye blurred vision. Persistent blurred vision and tingling in the right arm. Headache is improving. EXAM: CT HEAD WITHOUT CONTRAST TECHNIQUE: Contiguous axial images were obtained from the base of the skull through the vertex without intravenous contrast. COMPARISON:  CT head without contrast 07/22/2017 FINDINGS: Brain: No acute infarct, hemorrhage, or mass lesion is present. The ventricles are of normal size. No significant white matter disease is present. No significant extra-axial fluid collection is present. The brainstem and cerebellum are normal. Vascular: No hyperdense vessel or unexpected calcification. Skull: Calvarium is intact. No focal lytic or blastic lesions are present. No significant extracranial soft tissue lesions are present. Sinuses/Orbits: The paranasal sinuses and mastoid air cells are clear. Globes and orbits are within normal limits. ASPECTS [facility]([place] Stroke Program Early CT Score) - Ganglionic level infarction (caudate, lentiform nuclei, internal capsule, insula, M1-M3 cortex): 7/7 - Supraganglionic infarction (M4-M6 cortex): 3/3 Total score (0-10 with 10 being normal): 10/10 IMPRESSION: 1. Negative CT of the head 2. ASPECTS is 10/10 These results were called by telephone at the time of interpretation on 01/16/2018 at 5:20 pm to Dr. Dorothea GlassmanPAUL MALINDA ,  who verbally acknowledged these results. Electronically Signed   By: Marin Robertshristopher  Mattern M.D.   On: 01/16/2018 17:22    ____________________________________________   PROCEDURES  Procedure(s) performed:   Procedures  Critical Care performed:   ____________________________________________   INITIAL IMPRESSION / ASSESSMENT AND PLAN / ED COURSE  ----------------------------------------- 6:43 PM on 01/16/2018 -----------------------------------------  Neurology calls back says he really isn't a candidate for TPA he has minimal symptoms at this present time. He doesn't think he wanted a CT head neck angiogram.         ____________________________________________   FINAL CLINICAL IMPRESSION(S) / ED DIAGNOSES  Final diagnoses:  Cerebrovascular accident (CVA), unspecified mechanism [facility](HCC)     ED Discharge Orders    None       Note:  This document was prepared using Dragon voice recognition software and may include unintentional dictation errors.    Arnaldo NatalMalinda, Paul F, MD 01/16/18 2035

## 2018-01-16 NOTE — ED Notes (Signed)
ED Provider at bedside. 

## 2018-01-16 NOTE — Progress Notes (Signed)
CODE STROKE- PHARMACY COMMUNICATION   Time CODE STROKE called/page received:1707  Time response to CODE STROKE was made (in person or via phone): phone  Name of Provider/Nurse contacted:Amber--Pt in CT   Pharmacy Tech contacted to conduct medication reconciliation.   Past Medical History:  Diagnosis Date  . Tobacco abuse    Prior to Admission medications   Medication Sig Start Date End Date Taking? Authorizing Provider  oseltamivir (TAMIFLU) 75 MG capsule Take 1 capsule (75 mg total) by mouth 2 (two) times daily. 10/19/17   Faythe GheeFisher, Susan W, PA-C    Cleopatra CedarStephanie Karlee Staff ,PharmD Pharmacy Resident  01/16/2018  5:11 PM

## 2018-01-17 ENCOUNTER — Observation Stay: Payer: Self-pay

## 2018-01-17 ENCOUNTER — Observation Stay
Admit: 2018-01-17 | Discharge: 2018-01-17 | Disposition: A | Discharge status: Home / Self Care | Payer: Self-pay | Attending: Internal Medicine | Admitting: Internal Medicine

## 2018-01-17 DIAGNOSIS — R202 Paresthesia of skin: Secondary | ICD-10-CM

## 2018-01-17 DIAGNOSIS — R200 Anesthesia of skin: Secondary | ICD-10-CM

## 2018-01-17 LAB — LIPID PANEL
Cholesterol: 114 mg/dL (ref 0–200)
HDL: 46 mg/dL (ref >40)
LDL CALC: 49 mg/dL (ref 0–99)
TRIGLYCERIDES: 95 mg/dL (ref <150)
Total CHOL/HDL Ratio: 2.5 RATIO
VLDL: 19 mg/dL (ref 0–40)

## 2018-01-17 LAB — HEMOGLOBIN A1C
Hgb A1c MFr Bld: 5.5 % (ref 4.8–5.6)
Mean Plasma Glucose: 111.15 mg/dL

## 2018-01-17 LAB — VITAMIN B12: VITAMIN B 12: 209 pg/mL (ref 180–914)

## 2018-01-17 LAB — ECHOCARDIOGRAM COMPLETE
HEIGHTINCHES: 77 in
WEIGHTICAEL: 2030.4 [oz_av]

## 2018-01-17 MED ORDER — MAGNESIUM SULFATE 2 GM/50ML IV SOLN: 2.0000 g | Freq: Once | INTRAVENOUS | Status: DC

## 2018-01-17 MED ORDER — ASPIRIN 325 MG PO TBEC: 325.0000 mg | DELAYED_RELEASE_TABLET | Freq: Every day | ORAL | 0 refills | Status: AC

## 2018-01-17 MED ORDER — LORAZEPAM 2 MG/ML IJ SOLN
1.0000 mg | Freq: Once | INTRAMUSCULAR | Status: AC
  Administered 2018-01-17: 1 mg via INTRAVENOUS
  Filled 2018-01-17: qty 1

## 2018-01-17 NOTE — Progress Notes (Signed)
Referring Physician:  Dr. Allena Katz    Chief Complaint: R sided numbness   HPI: JAHDEN SCHARA is an 42 y.o. male male with a known history of asthma and tobacco abuse. Patient presented to emergency room for acute onset of severe headache, associated with right eye blurry vision, numbness and tingling of the right face and its right upper and lower extremities.  His symptoms started approximately 45 minutes before presenting to emergency room Symptoms resolved and he is close to baseline. No abnormalities on CTH or CTA. MRI no acute abnormality.      Date last known well: Date: 01/16/2018 Time last known well: Unable to determine tPA Given: No: low NIHSS, headache and improving symptoms  Past Medical History:  Diagnosis Date  . Asthma   . Tobacco abuse     Past Surgical History:  Procedure Laterality Date  . TONSILLECTOMY      Family History  Problem Relation Age of Onset  . Diabetes Father   . CVA Father   . Heart disease Paternal Grandmother    Social History:  reports that he has been smoking cigarettes.  He has a 30.00 pack-year smoking history. He has never used smokeless tobacco. He reports that he has current or past drug history. Drug: Marijuana. He reports that he does not drink alcohol.  Allergies: No Known Allergies  Medications: I have reviewed the patient's current medications.  ROS: History obtained from the patient  General ROS: negative for - chills, fatigue, fever, night sweats, weight gain or weight loss Psychological ROS: negative for - behavioral disorder, hallucinations, memory difficulties, mood swings or suicidal ideation Ophthalmic ROS: negative for - blurry vision, double vision, eye pain or loss of vision ENT ROS: negative for - epistaxis, nasal discharge, oral lesions, sore throat, tinnitus or vertigo Allergy and Immunology ROS: negative for - hives or itchy/watery eyes Hematological and Lymphatic ROS: negative for - bleeding problems, bruising or  swollen lymph nodes Endocrine ROS: negative for - galactorrhea, hair pattern changes, polydipsia/polyuria or temperature intolerance Respiratory ROS: negative for - cough, hemoptysis, shortness of breath or wheezing Cardiovascular ROS: negative for - chest pain, dyspnea on exertion, edema or irregular heartbeat Gastrointestinal ROS: negative for - abdominal pain, diarrhea, hematemesis, nausea/vomiting or stool incontinence Genito-Urinary ROS: negative for - dysuria, hematuria, incontinence or urinary frequency/urgency Musculoskeletal ROS: negative for - joint swelling or muscular weakness Neurological ROS: as noted in HPI Dermatological ROS: negative for rash and skin lesion changes  Physical Examination: Blood pressure 109/75, pulse 64, temperature 97.8 F (36.6 C), temperature source Oral, resp. rate 16, height 6\' 5"  (1.956 m), weight 126 lb 14.4 oz (57.6 kg), SpO2 99 %.   Neurological Examination   Mental Status: Alert, oriented, thought content appropriate.  Speech fluent without evidence of aphasia.  Able to follow 3 step commands without difficulty. Cranial Nerves: II: Discs flat bilaterally; Visual fields grossly normal, pupils equal, round, reactive to light and accommodation III,IV, VI: ptosis not present, extra-ocular motions intact bilaterally V,VII: smile symmetric, facial light touch sensation normal bilaterally VIII: hearing normal bilaterally IX,X: gag reflex present XI: bilateral shoulder shrug XII: midline tongue extension Motor: Right : Upper extremity   5/5    Left:     Upper extremity   5/5  Lower extremity   5/5     Lower extremity   5/5 Tone and bulk:normal tone throughout; no atrophy noted Sensory: Pinprick and light touch intact throughout, bilaterally Deep Tendon Reflexes: 2+ and symmetric throughout Plantars: Right: downgoing  Left: downgoing Cerebellar: normal finger-to-nose, normal rapid alternating movements and normal heel-to-shin test Gait: not  tested     Laboratory Studies:  Basic Metabolic Panel: Recent Labs  Lab 01/16/18 1722  NA 139  K 3.8  CL 105  CO2 26  GLUCOSE 111*  BUN 13  CREATININE 1.10  CALCIUM 8.8*    Liver Function Tests: Recent Labs  Lab 01/16/18 1722  AST 22  ALT 12*  ALKPHOS 59  BILITOT 0.6  PROT 7.1  ALBUMIN 4.2   No results for input(s): LIPASE, AMYLASE in the last 168 hours. No results for input(s): AMMONIA in the last 168 hours.  CBC: Recent Labs  Lab 01/16/18 1722  WBC 7.5  NEUTROABS 5.1  HGB 14.8  HCT 43.5  MCV 97.2  PLT 274    Cardiac Enzymes: Recent Labs  Lab 01/16/18 1722  TROPONINI <0.03    BNP: Invalid input(s): POCBNP  CBG: Recent Labs  Lab 01/16/18 1714  GLUCAP 128*    Microbiology: No results found for this or any previous visit.  Coagulation Studies: Recent Labs    01/16/18 1722  LABPROT 13.0  INR 0.99    Urinalysis: No results for input(s): COLORURINE, LABSPEC, PHURINE, GLUCOSEU, HGBUR, BILIRUBINUR, KETONESUR, PROTEINUR, UROBILINOGEN, NITRITE, LEUKOCYTESUR in the last 168 hours.  Invalid input(s): APPERANCEUR  Lipid Panel:    Component Value Date/Time   CHOL 114 01/17/2018 0442   TRIG 95 01/17/2018 0442   HDL 46 01/17/2018 0442   CHOLHDL 2.5 01/17/2018 0442   VLDL 19 01/17/2018 0442   LDLCALC 49 01/17/2018 0442    HgbA1C:  Lab Results  Component Value Date   HGBA1C 5.5 01/17/2018    Urine Drug Screen:      Component Value Date/Time   LABOPIA NONE DETECTED 10/08/2007 1944   COCAINSCRNUR NONE DETECTED 10/08/2007 1944   LABBENZ NONE DETECTED 10/08/2007 1944   AMPHETMU NONE DETECTED 10/08/2007 1944   THCU NONE DETECTED 10/08/2007 1944   LABBARB  10/08/2007 1944    NONE DETECTED        DRUG SCREEN FOR MEDICAL PURPOSES ONLY.  IF CONFIRMATION IS NEEDED FOR ANY PURPOSE, NOTIFY LAB WITHIN 5 DAYS.    Alcohol Level: No results for input(s): ETH in the last 168 hours.  Other results: EKG: normal EKG, normal sinus rhythm,  unchanged from previous tracings.  Imaging: Ct Angio Head W Or Wo Contrast  Result Date: 01/16/2018 CLINICAL DATA:  Code stroke. Headache. Blurred vision in the right eye. The headache is improving. EXAM: CT ANGIOGRAPHY HEAD AND NECK TECHNIQUE: Multidetector CT imaging of the head and neck was performed using the standard protocol during bolus administration of intravenous contrast. Multiplanar CT image reconstructions and MIPs were obtained to evaluate the vascular anatomy. Carotid stenosis measurements (when applicable) are obtained utilizing NASCET criteria, using the distal internal carotid diameter as the denominator. CONTRAST:  75mL OMNIPAQUE IOHEXOL 300 MG/ML  SOLN COMPARISON:  None. FINDINGS: CTA NECK FINDINGS Aortic arch: A 3 vessel arch configuration is present. There is no focal stenosis or atherosclerotic disease. Right carotid system: The right common carotid artery is within normal limits. The right carotid bifurcation is normal. The cervical right ICA is within normal limits. Left carotid system: The left common carotid artery is within normal limits. Bifurcation is unremarkable. The cervical left ICA is normal. Vertebral arteries: The left vertebral artery is slightly dominant to the right. Both vertebral arteries originate from the subclavian arteries without significant stenosis. The right PICA origin is below the dural  margin. There is no significant stenosis of either vertebral artery in the neck. Skeleton: Vertebral body heights alignment are maintained. Endplate changes and uncovertebral spurring results and foraminal narrowing bilaterally at C6-7, left greater than right. Other neck: No significant mucosal or submucosal lesions are present. The thyroid is within normal limits. There is no significant cervical adenopathy. Salivary glands are within normal limits bilaterally. Upper chest: Centrilobular emphysematous changes are present at the lung apices. There is scarring at the lung  apices. A 6 mm left apical nodule is present. This may be postinflammatory. There is some retraction of the hila bilaterally. Paraseptal emphysematous changes are noted as well. Review of the MIP images confirms the above findings CTA HEAD FINDINGS Anterior circulation: The internal carotid arteries are within normal limits through the ICA termini bilaterally. The A1 and M1 segments are normal. The anterior communicating artery is patent. MCA bifurcations are intact. The ACA and MCA branch vessels are within normal limits. Posterior circulation: The left vertebral artery is the dominant vessel of the dural margin. The left PICA origin is visualized and normal. Vertebrobasilar junction is normal. Basilar artery is somewhat small. Both posterior cerebral arteries are fed from P1 segments and posterior communicating arteries. PCA branch vessels are within normal limits bilaterally. Venous sinuses: The dural sinuses are patent. Straight sinus and deep cerebral veins are intact. Cortical veins are unremarkable. Anatomic variants: None. Delayed phase: The postcontrast images demonstrate no pathologic enhancement. Review of the MIP images confirms the above findings IMPRESSION: 1. No significant vascular disease or stenosis in the neck. 2. Normal variant CTA circle of Willis without significant proximal stenosis, aneurysm, or branch vessel occlusion. 3. 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 This could impact the C7 nerve roots. Electronically Signed   By: Marin Roberts M.D.   On: 01/16/2018 20:08   Ct Angio Neck W And/or Wo Contrast  Result Date: 01/16/2018 CLINICAL DATA:  Code stroke. Headache. Blurred vision in the right eye. The headache is improving. EXAM: CT ANGIOGRAPHY HEAD AND NECK TECHNIQUE: Multidetector CT imaging of the head and neck was performed using the standard protocol during bolus administration of  intravenous contrast. Multiplanar CT image reconstructions and MIPs were obtained to evaluate the vascular anatomy. Carotid stenosis measurements (when applicable) are obtained utilizing NASCET criteria, using the distal internal carotid diameter as the denominator. CONTRAST:  75mL OMNIPAQUE IOHEXOL 300 MG/ML  SOLN COMPARISON:  None. FINDINGS: CTA NECK FINDINGS Aortic arch: A 3 vessel arch configuration is present. There is no focal stenosis or atherosclerotic disease. Right carotid system: The right common carotid artery is within normal limits. The right carotid bifurcation is normal. The cervical right ICA is within normal limits. Left carotid system: The left common carotid artery is within normal limits. Bifurcation is unremarkable. The cervical left ICA is normal. Vertebral arteries: The left vertebral artery is slightly dominant to the right. Both vertebral arteries originate from the subclavian arteries without significant stenosis. The right PICA origin is below the dural margin. There is no significant stenosis of either vertebral artery in the neck. Skeleton: Vertebral body heights alignment are maintained. Endplate changes and uncovertebral spurring results and foraminal narrowing bilaterally at C6-7, left greater than right. Other neck: No significant mucosal or submucosal lesions are present. The thyroid is within normal limits. There is no significant cervical adenopathy. Salivary glands are within normal limits bilaterally. Upper chest: Centrilobular emphysematous changes are present at the lung apices.  There is scarring at the lung apices. A 6 mm left apical nodule is present. This may be postinflammatory. There is some retraction of the hila bilaterally. Paraseptal emphysematous changes are noted as well. Review of the MIP images confirms the above findings CTA HEAD FINDINGS Anterior circulation: The internal carotid arteries are within normal limits through the ICA termini bilaterally. The A1 and  M1 segments are normal. The anterior communicating artery is patent. MCA bifurcations are intact. The ACA and MCA branch vessels are within normal limits. Posterior circulation: The left vertebral artery is the dominant vessel of the dural margin. The left PICA origin is visualized and normal. Vertebrobasilar junction is normal. Basilar artery is somewhat small. Both posterior cerebral arteries are fed from P1 segments and posterior communicating arteries. PCA branch vessels are within normal limits bilaterally. Venous sinuses: The dural sinuses are patent. Straight sinus and deep cerebral veins are intact. Cortical veins are unremarkable. Anatomic variants: None. Delayed phase: The postcontrast images demonstrate no pathologic enhancement. Review of the MIP images confirms the above findings IMPRESSION: 1. No significant vascular disease or stenosis in the neck. 2. Normal variant CTA circle of Willis without significant proximal stenosis, aneurysm, or branch vessel occlusion. 3. 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 This could impact the C7 nerve roots. Electronically Signed   By: Marin Robertshristopher  Mattern M.D.   On: 01/16/2018 20:08   Ct Head Code Stroke Wo Contrast  Result Date: 01/16/2018 CLINICAL DATA:  Code stroke. Acute onset of headache and right eye blurred vision. Persistent blurred vision and tingling in the right arm. Headache is improving. EXAM: CT HEAD WITHOUT CONTRAST TECHNIQUE: Contiguous axial images were obtained from the base of the skull through the vertex without intravenous contrast. COMPARISON:  CT head without contrast 07/22/2017 FINDINGS: Brain: No acute infarct, hemorrhage, or mass lesion is present. The ventricles are of normal size. No significant white matter disease is present. No significant extra-axial fluid collection is present. The brainstem and cerebellum are normal. Vascular: No  hyperdense vessel or unexpected calcification. Skull: Calvarium is intact. No focal lytic or blastic lesions are present. No significant extracranial soft tissue lesions are present. Sinuses/Orbits: The paranasal sinuses and mastoid air cells are clear. Globes and orbits are within normal limits. ASPECTS [facility]([place] Stroke Program Early CT Score) - Ganglionic level infarction (caudate, lentiform nuclei, internal capsule, insula, M1-M3 cortex): 7/7 - Supraganglionic infarction (M4-M6 cortex): 3/3 Total score (0-10 with 10 being normal): 10/10 IMPRESSION: 1. Negative CT of the head 2. ASPECTS is 10/10 These results were called by telephone at the time of interpretation on 01/16/2018 at 5:20 pm to Dr. Dorothea GlassmanPAUL MALINDA , who verbally acknowledged these results. Electronically Signed   By: Marin Robertshristopher  Mattern M.D.   On: 01/16/2018 17:22    Assessment: 42 y.o. male with a known history of asthma and tobacco abuse. Patient presented to emergency room for acute onset of severe headache, associated with right eye blurry vision, numbness and tingling of the right face and its right upper and lower extremities.  His symptoms started approximately 45 minutes before presenting to emergency room Symptoms resolved and he is close to baseline. No abnormalities on CTH or CTA. MRI no acute abnormalities  - Symptoms are headache related and stress. Pt states was on xanax before.  - Headache for the past week currently 3/10 pressure like - likely d/c planning today - Magnesium will be given now and take 500mg  of  Magnesium and Vitamin B complex as out patient.     Pauletta Browns  01/17/2018, 10:59 AM

## 2018-01-17 NOTE — Progress Notes (Signed)
  January 17, 2018  Patient: Antonio Figueroa  Date of Birth: Jun 27, 1976  Date of Visit: 01/16/2018    To Whom It May Concern:  Antonio Figueroa was seen and treated at [facility] from 01/16/2018 until 01/17/2018. Antonio Figueroa  can return to work on Monday 01/18/2018.  Sincerely,   Enedina FinnerSona Patel, MD [phone]

## 2018-01-17 NOTE — Progress Notes (Addendum)
PT Cancellation Note  Patient Details Name: Antonio Figueroa MRN: [id number] DOB: 1976-01-16   Cancelled Treatment:    Reason Eval/Treat Not Completed: Patient at procedure or test/unavailable . PT attempted eval but pt not in room upon entry, as he's currently having an MRI. PT will re-attempt at a later date as pt is available.  Zerita BoersJennifer Demontae Antunes, PT,DPT 01/17/18 10:23 AM      Irineo Gaulin L 01/17/2018, 10:22 AM

## 2018-01-17 NOTE — Discharge Summary (Signed)
[facility] [place] [facility]   PATIENT NAME: Antonio Figueroa    MR#:  [id number]  DATE OF BIRTH:  12-13-1975  DATE OF ADMISSION:  01/16/2018 ADMITTING PHYSICIAN: Cammy Copa, MD  DATE OF DISCHARGE: 01/17/2018  PRIMARY CARE PHYSICIAN: Patient, No Pcp Per    ADMISSION DIAGNOSIS:  Cerebrovascular accident (CVA), unspecified mechanism (HCC) [I63.9]  DISCHARGE DIAGNOSIS:  Tension Headache  SECONDARY DIAGNOSIS:   Past Medical History:  Diagnosis Date  . Asthma   . Tobacco abuse     HOSPITAL COURSE:  Yunis Voorheis  is a 42 y.o. male with a known history of asthma and tobacco abuse. Patient presented to emergency room for acute onset of severe headache, associated with right eye blurry vision, numbness and tingling of the right face and its right upper and lower extremities.  1.  Right facial numbness with  Right upper extremity tingling and numbness and Right eye blurry vision suspected due to Tension Headache -ASA 325 mg daily -MRI brain negative -Echo ok -pt reports lot of stress lately -no neuro deficits -seen by neurology  2. Tobacco  abuse Advised cessation  Overall doing ok. D/c home   CONSULTS OBTAINED:  Treatment Team:  Pauletta Browns, MD  DRUG ALLERGIES:  No Known Allergies  DISCHARGE MEDICATIONS:   Allergies as of 01/17/2018   No Known Allergies     Medication List    STOP taking these medications   oseltamivir 75 MG capsule Commonly known as:  TAMIFLU     TAKE these medications   aspirin 325 MG EC tablet Take 1 tablet (325 mg total) by mouth daily. Start taking on:  01/18/2018       If you experience worsening of your admission symptoms, develop shortness of breath, life threatening emergency, suicidal or homicidal thoughts you must seek medical attention immediately by calling 911 or calling your MD immediately  if symptoms less severe.  You Must read complete instructions/literature along with all the possible  adverse reactions/side effects for all the Medicines you take and that have been prescribed to you. Take any new Medicines after you have completely understood and accept all the possible adverse reactions/side effects.   Please note  You were cared for by a hospitalist during your hospital stay. If you have any questions about your discharge medications or the care you received while you were in the hospital after you are discharged, you can call the unit and asked to speak with the hospitalist on call if the hospitalist that took care of you is not available. Once you are discharged, your primary care physician will handle any further medical issues. Please note that NO REFILLS for any discharge medications will be authorized once you are discharged, as it is imperative that you return to your primary care physician (or establish a relationship with a primary care physician if you do not have one) for your aftercare needs so that they can reassess your need for medications and monitor your lab values. Today   SUBJECTIVE   headache  VITAL SIGNS:  Blood pressure (!) 144/89, pulse 80, temperature 98 F (36.7 C), temperature source Oral, resp. rate 16, height 6\' 5"  (1.956 m), weight 57.6 kg (126 lb 14.4 oz), SpO2 100 %.  I/O:  No intake or output data in the 24 hours ending 01/17/18 1156  PHYSICAL EXAMINATION:  GENERAL:  42 y.o.-year-old patient lying in the bed with no acute distress. Thin cachectic EYES: Pupils equal, round, reactive to light and  accommodation. No scleral icterus. Extraocular muscles intact.  HEENT: Head atraumatic, normocephalic. Oropharynx and nasopharynx clear.  NECK:  Supple, no jugular venous distention. No thyroid enlargement, no tenderness.  LUNGS: Normal breath sounds bilaterally, no wheezing, rales,rhonchi or crepitation. No use of accessory muscles of respiration.  CARDIOVASCULAR: S1, S2 normal. No murmurs, rubs, or gallops.  ABDOMEN: Soft, non-tender,  non-distended. Bowel sounds present. No organomegaly or mass.  EXTREMITIES: No pedal edema, cyanosis, or clubbing.  NEUROLOGIC: Cranial nerves II through XII are intact. Muscle strength 5/5 in all extremities. Sensation intact. Gait not checked.  PSYCHIATRIC: The patient is alert and oriented x 3.  SKIN: No obvious rash, lesion, or ulcer.   DATA REVIEW:   CBC  Recent Labs  Lab 01/16/18 1722  WBC 7.5  HGB 14.8  HCT 43.5  PLT 274    Chemistries  Recent Labs  Lab 01/16/18 1722  NA 139  K 3.8  CL 105  CO2 26  GLUCOSE 111*  BUN 13  CREATININE 1.10  CALCIUM 8.8*  AST 22  ALT 12*  ALKPHOS 59  BILITOT 0.6    Microbiology Results   No results found for this or any previous visit (from the past 240 hour(s)).  RADIOLOGY:  Ct Angio Head W Or Wo Contrast  Result Date: 01/16/2018 CLINICAL DATA:  Code stroke. Headache. Blurred vision in the right eye. The headache is improving. EXAM: CT ANGIOGRAPHY HEAD AND NECK TECHNIQUE: Multidetector CT imaging of the head and neck was performed using the standard protocol during bolus administration of intravenous contrast. Multiplanar CT image reconstructions and MIPs were obtained to evaluate the vascular anatomy. Carotid stenosis measurements (when applicable) are obtained utilizing NASCET criteria, using the distal internal carotid diameter as the denominator. CONTRAST:  75mL OMNIPAQUE IOHEXOL 300 MG/ML  SOLN COMPARISON:  None. FINDINGS: CTA NECK FINDINGS Aortic arch: A 3 vessel arch configuration is present. There is no focal stenosis or atherosclerotic disease. Right carotid system: The right common carotid artery is within normal limits. The right carotid bifurcation is normal. The cervical right ICA is within normal limits. Left carotid system: The left common carotid artery is within normal limits. Bifurcation is unremarkable. The cervical left ICA is normal. Vertebral arteries: The left vertebral artery is slightly dominant to the right. Both  vertebral arteries originate from the subclavian arteries without significant stenosis. The right PICA origin is below the dural margin. There is no significant stenosis of either vertebral artery in the neck. Skeleton: Vertebral body heights alignment are maintained. Endplate changes and uncovertebral spurring results and foraminal narrowing bilaterally at C6-7, left greater than right. Other neck: No significant mucosal or submucosal lesions are present. The thyroid is within normal limits. There is no significant cervical adenopathy. Salivary glands are within normal limits bilaterally. Upper chest: Centrilobular emphysematous changes are present at the lung apices. There is scarring at the lung apices. A 6 mm left apical nodule is present. This may be postinflammatory. There is some retraction of the hila bilaterally. Paraseptal emphysematous changes are noted as well. Review of the MIP images confirms the above findings CTA HEAD FINDINGS Anterior circulation: The internal carotid arteries are within normal limits through the ICA termini bilaterally. The A1 and M1 segments are normal. The anterior communicating artery is patent. MCA bifurcations are intact. The ACA and MCA branch vessels are within normal limits. Posterior circulation: The left vertebral artery is the dominant vessel of the dural margin. The left PICA origin is visualized and normal. Vertebrobasilar junction is  normal. Basilar artery is somewhat small. Both posterior cerebral arteries are fed from P1 segments and posterior communicating arteries. PCA branch vessels are within normal limits bilaterally. Venous sinuses: The dural sinuses are patent. Straight sinus and deep cerebral veins are intact. Cortical veins are unremarkable. Anatomic variants: None. Delayed phase: The postcontrast images demonstrate no pathologic enhancement. Review of the MIP images confirms the above findings IMPRESSION: 1. No significant vascular disease or stenosis in  the neck. 2. Normal variant CTA circle of Willis without significant proximal stenosis, aneurysm, or branch vessel occlusion. 3. 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 This could impact the C7 nerve roots. Electronically Signed   By: Marin Roberts M.D.   On: 01/16/2018 20:08   Ct Angio Neck W And/or Wo Contrast  Result Date: 01/16/2018 CLINICAL DATA:  Code stroke. Headache. Blurred vision in the right eye. The headache is improving. EXAM: CT ANGIOGRAPHY HEAD AND NECK TECHNIQUE: Multidetector CT imaging of the head and neck was performed using the standard protocol during bolus administration of intravenous contrast. Multiplanar CT image reconstructions and MIPs were obtained to evaluate the vascular anatomy. Carotid stenosis measurements (when applicable) are obtained utilizing NASCET criteria, using the distal internal carotid diameter as the denominator. CONTRAST:  75mL OMNIPAQUE IOHEXOL 300 MG/ML  SOLN COMPARISON:  None. FINDINGS: CTA NECK FINDINGS Aortic arch: A 3 vessel arch configuration is present. There is no focal stenosis or atherosclerotic disease. Right carotid system: The right common carotid artery is within normal limits. The right carotid bifurcation is normal. The cervical right ICA is within normal limits. Left carotid system: The left common carotid artery is within normal limits. Bifurcation is unremarkable. The cervical left ICA is normal. Vertebral arteries: The left vertebral artery is slightly dominant to the right. Both vertebral arteries originate from the subclavian arteries without significant stenosis. The right PICA origin is below the dural margin. There is no significant stenosis of either vertebral artery in the neck. Skeleton: Vertebral body heights alignment are maintained. Endplate changes and uncovertebral spurring results and foraminal narrowing bilaterally at C6-7, left  greater than right. Other neck: No significant mucosal or submucosal lesions are present. The thyroid is within normal limits. There is no significant cervical adenopathy. Salivary glands are within normal limits bilaterally. Upper chest: Centrilobular emphysematous changes are present at the lung apices. There is scarring at the lung apices. A 6 mm left apical nodule is present. This may be postinflammatory. There is some retraction of the hila bilaterally. Paraseptal emphysematous changes are noted as well. Review of the MIP images confirms the above findings CTA HEAD FINDINGS Anterior circulation: The internal carotid arteries are within normal limits through the ICA termini bilaterally. The A1 and M1 segments are normal. The anterior communicating artery is patent. MCA bifurcations are intact. The ACA and MCA branch vessels are within normal limits. Posterior circulation: The left vertebral artery is the dominant vessel of the dural margin. The left PICA origin is visualized and normal. Vertebrobasilar junction is normal. Basilar artery is somewhat small. Both posterior cerebral arteries are fed from P1 segments and posterior communicating arteries. PCA branch vessels are within normal limits bilaterally. Venous sinuses: The dural sinuses are patent. Straight sinus and deep cerebral veins are intact. Cortical veins are unremarkable. Anatomic variants: None. Delayed phase: The postcontrast images demonstrate no pathologic enhancement. Review of the MIP images confirms the above findings IMPRESSION: 1. No significant vascular disease or stenosis in  the neck. 2. Normal variant CTA circle of Willis without significant proximal stenosis, aneurysm, or branch vessel occlusion. 3. 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 This could impact the C7 nerve roots. Electronically Signed   By: Marin Roberts M.D.   On: 01/16/2018  20:08   Mr Brain Wo Contrast  Result Date: 01/17/2018 CLINICAL DATA:  42 year old male status post code stroke presentation yesterday with headache, right eye blurred vision. EXAM: MRI HEAD WITHOUT CONTRAST TECHNIQUE: Multiplanar, multiecho pulse sequences of the brain and surrounding structures were obtained without intravenous contrast. COMPARISON:  CTA head and neck and noncontrast head CT 01/16/2018. FINDINGS: Brain: No restricted diffusion to suggest acute infarction. No midline shift, mass effect, evidence of mass lesion, ventriculomegaly, extra-axial collection or acute intracranial hemorrhage. Cervicomedullary junction and pituitary are within normal limits. Scattered small nonspecific cerebral white matter foci of T2 and FLAIR hyperintensity, mostly subcortical. The extent is within normal limits for age. No cortical encephalomalacia. No chronic cerebral blood products. Normal signal in the bilateral deep gray matter nuclei, brainstem, and cerebellum. Vascular: Major intracranial vascular flow voids are preserved. Skull and upper cervical spine: Negative visible cervical spine. Visualized bone marrow signal is within normal limits. Sinuses/Orbits: Normal orbits soft tissues. Mild to moderate scattered paranasal sinus mucosal thickening, most pronounced in the anterior ethmoids. Other: Mastoid air cells are clear. Visible internal auditory structures appear normal. Scalp and face soft tissues appear negative. IMPRESSION: No acute intracranial abnormality. Non-contrast MRI appearance of the brain is within normal limits. Electronically Signed   By: Odessa Fleming M.D.   On: 01/17/2018 11:02   Ct Head Code Stroke Wo Contrast  Result Date: 01/16/2018 CLINICAL DATA:  Code stroke. Acute onset of headache and right eye blurred vision. Persistent blurred vision and tingling in the right arm. Headache is improving. EXAM: CT HEAD WITHOUT CONTRAST TECHNIQUE: Contiguous axial images were obtained from the base of the  skull through the vertex without intravenous contrast. COMPARISON:  CT head without contrast 07/22/2017 FINDINGS: Brain: No acute infarct, hemorrhage, or mass lesion is present. The ventricles are of normal size. No significant white matter disease is present. No significant extra-axial fluid collection is present. The brainstem and cerebellum are normal. Vascular: No hyperdense vessel or unexpected calcification. Skull: Calvarium is intact. No focal lytic or blastic lesions are present. No significant extracranial soft tissue lesions are present. Sinuses/Orbits: The paranasal sinuses and mastoid air cells are clear. Globes and orbits are within normal limits. ASPECTS [facility] Stroke Program Early CT Score) - Ganglionic level infarction (caudate, lentiform nuclei, internal capsule, insula, M1-M3 cortex): 7/7 - Supraganglionic infarction (M4-M6 cortex): 3/3 Total score (0-10 with 10 being normal): 10/10 IMPRESSION: 1. Negative CT of the head 2. ASPECTS is 10/10 These results were called by telephone at the time of interpretation on 01/16/2018 at 5:20 pm to Dr. Dorothea Glassman , who verbally acknowledged these results. Electronically Signed   By: Marin Roberts M.D.   On: 01/16/2018 17:22     Management plans discussed with the patient, family and they are in agreement.  CODE STATUS:     Code Status Orders  (From admission, onward)        Start     Ordered   01/16/18 2323  Full code  Continuous     01/16/18 2322    Code Status History    Date Active Date Inactive Code Status Order ID Comments User Context  12/03/2016 1906 12/04/2016 2144 Full Code [id number]  Houston Siren, MD Inpatient      TOTAL TIME TAKING CARE OF THIS PATIENT: 40 minutes.    Enedina Finner M.D on 01/17/2018 at 11:56 AM  Between 7am to 6pm - Pager - [phone] After 6pm go to [email or web address] - password [email or web address]  [facility]  Office  [phone]  CC: Primary care physician; Patient, No Pcp  Per

## 2018-01-17 NOTE — Discharge Instructions (Signed)
Pt advised to get PCP in the area °

## 2018-01-18 LAB — HIV ANTIBODY (ROUTINE TESTING W REFLEX): HIV Screen 4th Generation wRfx: NONREACTIVE

## 2019-07-19 ENCOUNTER — Other Ambulatory Visit: Payer: Self-pay

## 2019-07-19 ENCOUNTER — Encounter: Payer: Self-pay | Admitting: Emergency Medicine

## 2019-07-19 ENCOUNTER — Emergency Department
Admission: EM | Admit: 2019-07-19 | Discharge: 2019-07-19 | Disposition: A | Discharge status: Home / Self Care | Payer: Self-pay | Attending: Emergency Medicine | Admitting: Emergency Medicine

## 2019-07-19 DIAGNOSIS — J449 Chronic obstructive pulmonary disease, unspecified: Secondary | ICD-10-CM | POA: Insufficient documentation

## 2019-07-19 DIAGNOSIS — Z7982 Long term (current) use of aspirin: Secondary | ICD-10-CM | POA: Insufficient documentation

## 2019-07-19 DIAGNOSIS — J45909 Unspecified asthma, uncomplicated: Secondary | ICD-10-CM | POA: Insufficient documentation

## 2019-07-19 DIAGNOSIS — B349 Viral infection, unspecified: Secondary | ICD-10-CM

## 2019-07-19 DIAGNOSIS — F17210 Nicotine dependence, cigarettes, uncomplicated: Secondary | ICD-10-CM | POA: Insufficient documentation

## 2019-07-19 DIAGNOSIS — Z20828 Contact with and (suspected) exposure to other viral communicable diseases: Secondary | ICD-10-CM | POA: Insufficient documentation

## 2019-07-19 DIAGNOSIS — Z20822 Contact with and (suspected) exposure to covid-19: Secondary | ICD-10-CM

## 2019-07-19 LAB — INFLUENZA PANEL BY PCR (TYPE A & B)
Influenza A By PCR: NEGATIVE
Influenza B By PCR: NEGATIVE

## 2019-07-19 NOTE — ED Provider Notes (Signed)
[facility] Emergency Department Provider Note  ____________________________________________  Time seen: Approximately 4:58 PM  I have reviewed the triage vital signs and the nursing notes.   HISTORY  Chief Complaint Fever, Cough, and Generalized Body Aches    HPI Antonio Figueroa is a 43 y.o. male who presents the emergency department complaining of nasal congestion, sore throat, cough, fevers and chills, body aches.  Patient states that he developed nasal congestion approximately 3 days ago and has had the increase symptoms over the past several days.  Patient states that he was coughing at work when his boss heard and sent him to the emergency department for Covid swab.  Patient states that he does not "feel that sick" but is here for the test so he can return to work.  Patient has been taking over-the-counter medications.  No other complaints at this time.  Patient with medical history as described below with no complaints with chronic medical problems.         Past Medical History:  Diagnosis Date  . Asthma   . Tobacco abuse     Patient Active Problem List   Diagnosis Date Noted  . Numbness and tingling 01/16/2018  . Chest pain 12/03/2016  . DENTAL CARIES LIMITED TO ENAMEL 11/17/2007  . THIGH, PAIN 11/05/2007  . ERECTILE DYSFUNCTION 07/15/2007  . ANKLE INJURY, RIGHT 07/15/2007  . TIA 01/27/2007  . 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 ANXIETY DISORDER 06/22/2006  . PANIC DISORDER 06/22/2006  . TOBACCO ABUSE 06/22/2006  . 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 TONSILLECTOMY      Prior to Admission medications   Medication Sig Start Date End Date Taking? Authorizing Provider  aspirin EC 325 MG EC tablet Take 1 tablet (325 mg total) by mouth daily. 01/18/18   Enedina FinnerPatel, Sona, MD    Allergies Patient has no known allergies.  Family History  Problem Relation Age of Onset  . Diabetes Father   . CVA  Father   . Heart disease Paternal Grandmother     Social History Social History   Tobacco Use  . Smoking status: Current Every Day Smoker    Packs/day: 1.00    Years: 30.00    Pack years: 30.00    Types: Cigarettes  . Smokeless tobacco: Never Used  Substance Use Topics  . Alcohol use: No  . Drug use: Not Currently    Types: Marijuana     Review of Systems  Constitutional: Positive fever/chills.  Positive for body aches Eyes: No visual changes. No discharge ENT: Positive for nasal congestion and sore throat Cardiovascular: no chest pain. Respiratory: Positive cough. No SOB. Gastrointestinal: No abdominal pain.  No nausea, no vomiting.  No diarrhea.  No constipation. Musculoskeletal: Negative for musculoskeletal pain. Skin: Negative for rash, abrasions, lacerations, ecchymosis. Neurological: Negative for headaches, focal weakness or numbness. 10-point ROS otherwise negative.  ____________________________________________   PHYSICAL EXAM:  VITAL SIGNS: ED Triage Vitals  Enc Vitals Group     BP 07/19/19 1617 (!) 126/94     Pulse Rate 07/19/19 1617 76     Resp 07/19/19 1617 18     Temp 07/19/19 1617 98 F (36.7 C)     Temp Source 07/19/19 1617 Oral     SpO2 07/19/19 1617 100 %     Weight 07/19/19 1612 148 lb (67.1 kg)     Height 07/19/19 1612 6\' 4"  (1.93 m)     Head Circumference --  Peak Flow --      Pain Score 07/19/19 1611 3     Pain Loc --      Pain Edu? --      Excl. in GC? --      Constitutional: Alert and oriented. Well appearing and in no acute distress. Eyes: Conjunctivae are normal. PERRL. EOMI. Head: Atraumatic. ENT:      Ears: EACs and TMs unremarkable bilaterally.      Nose: No congestion/rhinnorhea.      Mouth/Throat: Mucous membranes are moist.  Oropharynx is nonerythematous and nonedematous.  Uvula is midline. Neck: No stridor.  Neck is supple full range of motion Hematological/Lymphatic/Immunilogical: No cervical  lymphadenopathy. Cardiovascular: Normal rate, regular rhythm. Normal S1 and S2.  Good peripheral circulation. Respiratory: Normal respiratory effort without tachypnea or retractions. Lungs CTAB. Good air entry to the bases with no decreased or absent breath sounds. Musculoskeletal: Full range of motion to all extremities. No gross deformities appreciated. Neurologic:  Normal speech and language. No gross focal neurologic deficits are appreciated.  Skin:  Skin is warm, dry and intact. No rash noted. Psychiatric: Mood and affect are normal. Speech and behavior are normal. Patient exhibits appropriate insight and judgement.   ____________________________________________   LABS (all labs ordered are listed, but only abnormal results are displayed)  Labs Reviewed  NOVEL CORONAVIRUS, NAA (HOSP ORDER, SEND-OUT TO REF LAB; TAT 18-24 HRS)  INFLUENZA PANEL BY PCR (TYPE A & B)   ____________________________________________  EKG   ____________________________________________  RADIOLOGY   No results found.  ____________________________________________    PROCEDURES  Procedure(s) performed:    Procedures    Medications - No data to display   ____________________________________________   INITIAL IMPRESSION / ASSESSMENT AND PLAN / ED COURSE  Pertinent labs & imaging results that were available during my care of the patient were reviewed by me and considered in my medical decision making (see chart for details).  Review of the [place] CSRS was performed in accordance of the [employer] prior to dispensing any controlled drugs.        The patient was evaluated for the symptoms described in the history of present illness. The patient was evaluated in the context of the global COVID-19 pandemic, which necessitated consideration that the patient might be at risk for infection with the SARS-CoV-2 virus that causes COVID-19. Institutional protocols and algorithms that pertain to the  evaluation of patients at risk for COVID-19 are in a state of rapid change based on information released by regulatory bodies including the CDC and federal and state organizations. The most current policies and algorithms were followed during the patient's care in the ED.     Patient's diagnosis is consistent with viral illness, encounter for Covid test.  Patient presented to emergency department with viral symptoms.  Overall exam is reassuring.  Patient will be tested for Covid and influenza at this time.  Patient is discharged prior to return results.  Discussed at home treatments.  We discussed return to work restrictions based off of testing.  I will prescribe Bromfed cough syrup for symptom relief.  Follow-up primary care as needed..  Patient is given ED precautions to return to the ED for any worsening or new symptoms.     ____________________________________________  FINAL CLINICAL IMPRESSION(S) / ED DIAGNOSES  Final diagnoses:  Viral illness  Encounter for laboratory testing for COVID-19 virus      NEW MEDICATIONS STARTED DURING THIS VISIT:  ED Discharge Orders    None  This chart was dictated using voice recognition software/Dragon. Despite best efforts to proofread, errors can occur which can change the meaning. Any change was purely unintentional.    Darletta Moll, PA-C 07/19/19 1705    Earleen Newport, MD 07/19/19 [phone]

## 2019-07-19 NOTE — ED Triage Notes (Signed)
Pt states started with flu-like sx's yesterday and has to be cleared before he can go back to work.

## 2019-07-21 LAB — NOVEL CORONAVIRUS, NAA (HOSP ORDER, SEND-OUT TO REF LAB; TAT 18-24 HRS): SARS-CoV-2, NAA: NOT DETECTED

## 2022-10-17 ENCOUNTER — Encounter: Payer: Self-pay | Admitting: Intensive Care

## 2022-10-17 ENCOUNTER — Emergency Department: Payer: Self-pay

## 2022-10-17 ENCOUNTER — Other Ambulatory Visit: Payer: Self-pay

## 2022-10-17 ENCOUNTER — Emergency Department
Admission: EM | Admit: 2022-10-17 | Discharge: 2022-10-17 | Disposition: A | Discharge status: Home / Self Care | Payer: Self-pay | Attending: Student in an Organized Health Care Education/Training Program | Admitting: Student in an Organized Health Care Education/Training Program

## 2022-10-17 DIAGNOSIS — Y9339 Activity, other involving climbing, rappelling and jumping off: Secondary | ICD-10-CM | POA: Insufficient documentation

## 2022-10-17 DIAGNOSIS — X501XXA Overexertion from prolonged static or awkward postures, initial encounter: Secondary | ICD-10-CM | POA: Insufficient documentation

## 2022-10-17 DIAGNOSIS — S8392XA Sprain of unspecified site of left knee, initial encounter: Secondary | ICD-10-CM | POA: Insufficient documentation

## 2022-10-17 DIAGNOSIS — J45909 Unspecified asthma, uncomplicated: Secondary | ICD-10-CM | POA: Insufficient documentation

## 2022-10-17 DIAGNOSIS — F17210 Nicotine dependence, cigarettes, uncomplicated: Secondary | ICD-10-CM | POA: Insufficient documentation

## 2022-10-17 NOTE — ED Triage Notes (Signed)
Patient had mechanical fall yesterday and hurt left knee. Reports some issues with left knee before fall. Able to ambulate into triage with NAD noted

## 2022-10-17 NOTE — Discharge Instructions (Addendum)
Follow-up with your regular doctor or orthopedics if not improving in a week.  Wear the knee brace when up walking around.  Apply ice.  Tylenol or ibuprofen for pain as needed.  Return if worsening

## 2022-10-17 NOTE — ED Provider Notes (Signed)
[facility] Provider Note    Event Date/Time   First MD Initiated Contact with Patient 10/17/22 1643     (approximate)   History   Leg Pain   HPI  Antonio Figueroa is a 47 y.o. male with history of asthma, 1 pack/day smoker times at least 30 years, presents emergency department complaining of left leg pain.  Patient states it feels like ice and cold when his leg starts to hurt.  No numbness or tingling.  Also complaining of left knee pain.  Was having pain and then jumped off something and hurt his knee causing more pain      Physical Exam   Triage Vital Signs: ED Triage Vitals [10/17/22 1638]  Enc Vitals Group     BP 119/80     Pulse Rate 96     Resp 16     Temp 97.8 F (36.6 C)     Temp Source Oral     SpO2 97 %     Weight 175 lb (79.4 kg)     Height '6\' 4"'$  (1.93 m)     Head Circumference      Peak Flow      Pain Score 6     Pain Loc      Pain Edu?      Excl. in [place]?     Most recent vital signs: Vitals:   10/17/22 1638  BP: 119/80  Pulse: 96  Resp: 16  Temp: 97.8 F (36.6 C)  SpO2: 97%     General: Awake, no distress.   CV:  Good peripheral perfusion. regular rate and  rhythm Resp:  Normal effort.  Abd:  No distention.   Other:  Left leg skin is warm, distal pulses intact, leg does not appear left.knee tender at the joint line   ED Results / Procedures / Treatments   Labs (all labs ordered are listed, but only abnormal results are displayed) Labs Reviewed - No data to display   EKG     RADIOLOGY X-ray left knee, ultrasound left lower extremity    PROCEDURES:   Procedures   MEDICATIONS ORDERED IN ED: Medications - No data to display   IMPRESSION / MDM / [place] / ED COURSE  I reviewed the triage vital signs and the nursing notes.                              Differential diagnosis includes, but is not limited to, sprain, strain, DVT, fracture  Patient's presentation is most consistent  with acute presentation with potential threat to life or bodily function.   X-ray of the left knee, ultrasound left lower extremity for DVT  X-ray of the left knee independently reviewed and interpreted by me as being negative for any acute  Ultrasound left lower extremity, did review the images and the radiologist report.  I interpret this is negative  I did explain this to the patient.  He was to follow-up with orthopedics if not improving 1 week.  Return if worsening.  Smoking cessation discussed.  Take Tylenol and ibuprofen for pain as needed.  Discharged in stable condition.     FINAL CLINICAL IMPRESSION(S) / ED DIAGNOSES   Final diagnoses:  Sprain of left knee, unspecified ligament, initial encounter     Rx / DC Orders   ED Discharge Orders     None        Note:  This document was prepared using Dragon voice recognition software and may include unintentional dictation errors.    Versie Starks, PA-C 10/17/22 1813    Merlyn Lot, MD 10/17/22 [phone]

## 2023-02-19 ENCOUNTER — Emergency Department
Admission: EM | Admit: 2023-02-19 | Discharge: 2023-02-19 | Disposition: A | Discharge status: Home / Self Care | Payer: Self-pay | Attending: Emergency Medicine | Admitting: Emergency Medicine

## 2023-02-19 ENCOUNTER — Other Ambulatory Visit: Payer: Self-pay

## 2023-02-19 ENCOUNTER — Emergency Department: Payer: Self-pay

## 2023-02-19 DIAGNOSIS — R1031 Right lower quadrant pain: Secondary | ICD-10-CM | POA: Insufficient documentation

## 2023-02-19 LAB — URINALYSIS, ROUTINE W REFLEX MICROSCOPIC
Bilirubin Urine: NEGATIVE
Glucose, UA: NEGATIVE mg/dL
Hgb urine dipstick: NEGATIVE
Ketones, ur: NEGATIVE mg/dL
Leukocytes,Ua: NEGATIVE
Nitrite: NEGATIVE
Protein, ur: NEGATIVE mg/dL
Specific Gravity, Urine: 1.018 (ref 1.005–1.030)
pH: 6 (ref 5.0–8.0)

## 2023-02-19 MED ORDER — CYCLOBENZAPRINE HCL 10 MG PO TABS: 10.0000 mg | ORAL_TABLET | Freq: Three times a day (TID) | ORAL | 0 refills | Status: AC | PRN

## 2023-02-19 NOTE — ED Notes (Signed)
Back from Ultrasound

## 2023-02-19 NOTE — ED Notes (Signed)
With US.

## 2023-02-19 NOTE — ED Provider Notes (Signed)
[facility] Provider Note   Event Date/Time   First MD Initiated Contact with Patient 02/19/23 2118     (approximate) History  Groin Pain  HPI Antonio Figueroa is a 47 y.o. male who presents for right-sided groin pain after stepping into a hole abnormally approximately 3 days prior to arrival.  Patient states that after stepping wrong he felt a pop in the groin on the right side and has been feeling pain in his right testicle radiating through to his back and into his abdomen.  Patient describes it as 8/10 in severity and any movement worsens this pain. ROS: Patient currently denies any vision changes, tinnitus, difficulty speaking, facial droop, sore throat, chest pain, shortness of breath, abdominal pain, nausea/vomiting/diarrhea, dysuria, or weakness/numbness/paresthesias in any extremity   Physical Exam  Triage Vital Signs: ED Triage Vitals  Encounter Vitals Group     BP 02/19/23 2013 (!) 119/99     Systolic BP Percentile --      Diastolic BP Percentile --      Pulse Rate 02/19/23 2013 91     Resp 02/19/23 2013 18     Temp 02/19/23 2013 98.3 F (36.8 C)     Temp Source 02/19/23 2013 Oral     SpO2 02/19/23 2014 95 %     Weight 02/19/23 2013 170 lb (77.1 kg)     Height 02/19/23 2013 6\' 4"  (1.93 m)     Head Circumference --      Peak Flow --      Pain Score 02/19/23 2013 8     Pain Loc --      Pain Education --      Exclude from Growth Chart --    Most recent vital signs: Vitals:   02/19/23 2227 02/19/23 2230  BP: 122/84 131/81  Pulse: 74 64  Resp: 15 17  Temp:  98.4 F (36.9 C)  SpO2: 97% 97%   General: Awake, oriented x4. CV:  Good peripheral perfusion.  Resp:  Normal effort.  Abd:  No distention.  GU:  Regular circumcised external male genitalia without lesions or rashes.  Testicles in normal lie and normal cremaster reflex bilaterally Other:  Middle-aged Caucasian well-developed, well-nourished male laying in bed in no acute distress ED  Results / Procedures / Treatments  Labs (all labs ordered are listed, but only abnormal results are displayed) Labs Reviewed  URINALYSIS, ROUTINE W REFLEX MICROSCOPIC - Abnormal; Notable for the following components:      Result Value   Color, Urine YELLOW (*)    APPearance HAZY (*)    All other components within normal limits   RADIOLOGY ED MD interpretation: Ultrasound of the scrotum with Doppler shows no evidence of testicular abnormality or torsion -Agree with radiology assessment Official radiology report(s): US SCROTUM W/DOPPLER  Result Date: 02/19/2023 CLINICAL DATA:  Right testicular pain, groin pain EXAM: SCROTAL ULTRASOUND DOPPLER ULTRASOUND OF THE TESTICLES TECHNIQUE: Complete ultrasound examination of the testicles, epididymis, and other scrotal structures was performed. Color and spectral Doppler ultrasound were also utilized to evaluate blood flow to the testicles. COMPARISON:  None Available. FINDINGS: Right testicle Measurements: 4.4 x 2.4 x 2.9 cm. No mass or microlithiasis visualized. Left testicle Measurements: 4.8 x 2.5 x 2.6 cm. No mass or microlithiasis visualized. Right epididymis:  Normal in size and appearance. Left epididymis:  Normal in size and appearance. Hydrocele:  None visualized. Varicocele:  None visualized. Pulsed Doppler interrogation of both testes demonstrates normal low resistance arterial  and venous waveforms bilaterally. IMPRESSION: No evidence of testicular abnormality or torsion. Electronically Signed   By: Charlett Nose M.D.   On: 02/19/2023 22:26   PROCEDURES: Critical Care performed: No Procedures MEDICATIONS ORDERED IN ED: Medications - No data to display IMPRESSION / MDM / ASSESSMENT AND PLAN / ED COURSE  I reviewed the triage vital signs and the nursing notes.                             Patient's presentation is most consistent with acute presentation with potential threat to life or bodily function. The patient is suffering from testicular  pain, but based on the history, exam, and testing, I do not suspect that the patient has testicular torsion, abscess, severe cellulitis, [place] gangrene, or other emergent cause.  UA unremarkable US Scrotum: No abnormalities  Disposition: Plan follow up with primary care doctor for symptom re-check and possible referral to urology. Discussed return precautions at bedside. Discharge.   FINAL CLINICAL IMPRESSION(S) / ED DIAGNOSES   Final diagnoses:  Severe right groin pain   Rx / DC Orders   ED Discharge Orders          Ordered    cyclobenzaprine (FLEXERIL) 10 MG tablet  3 times daily PRN        02/19/23 2242           Note:  This document was prepared using Dragon voice recognition software and may include unintentional dictation errors.   Merwyn Katos, MD 02/19/23 2300

## 2023-02-19 NOTE — Discharge Instructions (Signed)
Please use ibuprofen (Motrin) up to 800 mg every 8 hours, naproxen (Naprosyn) up to 500 mg every 12 hours, and/or acetaminophen (Tylenol) up to 4 g/day for any continued pain.  Please do not use this medication regimen for longer than 7 days 

## 2023-02-19 NOTE — ED Triage Notes (Signed)
Patient reports stepping into a deep hole a few days ago and has been experiencing R inguinal pain since then. Intermittent radiation into lower abd, lower back, and R testicle. Pt ambulatory to triage. Alert and oriented following commands. Breathing unlabored speaking in full sentences with symmetric chest rise and fall.

## 2023-09-26 ENCOUNTER — Other Ambulatory Visit: Payer: Self-pay

## 2023-09-26 ENCOUNTER — Emergency Department
Admission: EM | Admit: 2023-09-26 | Discharge: 2023-09-27 | Disposition: A | Discharge status: Home / Self Care | Payer: Self-pay | Attending: Emergency Medicine | Admitting: Emergency Medicine

## 2023-09-26 DIAGNOSIS — R519 Headache, unspecified: Secondary | ICD-10-CM | POA: Insufficient documentation

## 2023-09-26 DIAGNOSIS — Z7982 Long term (current) use of aspirin: Secondary | ICD-10-CM | POA: Insufficient documentation

## 2023-09-26 DIAGNOSIS — Z720 Tobacco use: Secondary | ICD-10-CM | POA: Insufficient documentation

## 2023-09-26 DIAGNOSIS — J45909 Unspecified asthma, uncomplicated: Secondary | ICD-10-CM | POA: Insufficient documentation

## 2023-09-26 DIAGNOSIS — R202 Paresthesia of skin: Secondary | ICD-10-CM | POA: Insufficient documentation

## 2023-09-26 NOTE — ED Triage Notes (Addendum)
 Pt to ed from home via POV for numbness to his hands and feet since 0900 this morning. Pt is caox4, in no acute distress and ambulatory in triage. Pt denies any Cp, SOB, LOC or other symptoms. Pt has also had a headache for the last two weeks. Pt denies any medical HX and denies any ETOH and no drugs.

## 2023-09-26 NOTE — ED Provider Notes (Signed)
 [facility] Provider Note    Event Date/Time   First MD Initiated Contact with Patient 09/26/23 2305     (approximate)   History   hand numbness (Since 0900)   HPI  Antonio Figueroa is a 48 y.o. male with history of asthma, tobacco use who presents to the emergency department with his significant other with complaints of tingling in his arms and legs that started when he woke up this morning at about 9 AM.  States he has also had this intermittently in his forehead.  He states he went to bed in his normal state of health.  He states that he took an over-the-counter sexual supplement called " Magnum XL 9800" around 1 AM last night.  States he did not have any initial symptoms after taking his medication but is worried that the medication is causing his symptoms now.  No weakness.  He has had a headache for 2 weeks.  Denies any neck or back pain.  No difficulty walking.  No bowel or bladder incontinence.  No prior history of neuropathy.  Denies history of diabetes.  Has not drank alcohol in over 25 years.  Denies any drug use.  No other new medications.  No recent illness, fever.  No recent vaccination.  No prior history of MS, Guillain-Barr.    History provided by patient, significant other.    Past Medical History:  Diagnosis Date   Asthma    Tobacco abuse     Past Surgical History:  Procedure Laterality Date   TONSILLECTOMY      MEDICATIONS:  Prior to Admission medications   Medication Sig Start Date End Date Taking? Authorizing Provider  aspirin EC 325 MG EC tablet Take 1 tablet (325 mg total) by mouth daily. 01/18/18   Enedina Finner, MD  cyclobenzaprine (FLEXERIL) 10 MG tablet Take 1 tablet (10 mg total) by mouth 3 (three) times daily as needed for muscle spasms. 02/19/23   Merwyn Katos, MD    Physical Exam   Triage Vital Signs: ED Triage Vitals  Encounter Vitals Group     BP 09/26/23 2212 127/85     Systolic BP Percentile --      Diastolic  BP Percentile --      Pulse Rate 09/26/23 2212 82     Resp 09/26/23 2212 16     Temp 09/26/23 2212 97.9 F (36.6 C)     Temp Source 09/26/23 2212 Oral     SpO2 09/26/23 2212 96 %     Weight --      Height 09/26/23 2210 6\' 4"  (1.93 m)     Head Circumference --      Peak Flow --      Pain Score 09/26/23 2210 0     Pain Loc --      Pain Education --      Exclude from Growth Chart --     Most recent vital signs: Vitals:   09/26/23 2212  BP: 127/85  Pulse: 82  Resp: 16  Temp: 97.9 F (36.6 C)  SpO2: 96%    CONSTITUTIONAL: Alert, responds appropriately to questions. Well-appearing; well-nourished HEAD: Normocephalic, atraumatic EYES: Conjunctivae clear, pupils appear equal, sclera nonicteric ENT: normal nose; moist mucous membranes NECK: Supple, normal ROM CARD: RRR; S1 and S2 appreciated RESP: Normal chest excursion without splinting or tachypnea; breath sounds clear and equal bilaterally; no wheezes, no rhonchi, no rales, no hypoxia or respiratory distress, speaking full sentences ABD/GI: Non-distended; soft,  non-tender, no rebound, no guarding, no peritoneal signs BACK: The back appears normal EXT: Normal ROM in all joints; no deformity noted, no edema SKIN: Normal color for age and race; warm; no rash on exposed skin NEURO: Moves all extremities equally, normal speech, normal gait, reports tingling sensation from his bilateral feet to just below the knees and bilateral hands to mid forearm.  Strength 5/5 in all 4 extremities.  Diminished reflexes diffusely.  No clonus.  Cranial nerves II through XII intact. PSYCH: The patient's mood and manner are appropriate.   ED Results / Procedures / Treatments   LABS: (all labs ordered are listed, but only abnormal results are displayed) Labs Reviewed  CBC WITH DIFFERENTIAL/PLATELET - Abnormal; Notable for the following components:      Result Value   Eosinophils Absolute 0.6 (*)    All other components within normal limits   COMPREHENSIVE METABOLIC PANEL - Abnormal; Notable for the following components:   Total Protein 6.4 (*)    AST 14 (*)    All other components within normal limits  MAGNESIUM  TSH  VITAMIN B12  HIV ANTIBODY (ROUTINE TESTING W REFLEX)     EKG:     RADIOLOGY: My personal review and interpretation of imaging: CT head shows no acute abnormality.  I have personally reviewed all radiology reports.   CT HEAD WO CONTRAST ( ) Result Date: 09/27/2023 CLINICAL DATA:  Neuro deficit, acute, stroke suspected numbness in hands and feet EXAM: CT HEAD WITHOUT CONTRAST TECHNIQUE: Contiguous axial images were obtained from the base of the skull through the vertex without intravenous contrast. RADIATION DOSE REDUCTION: This exam was performed according to the departmental dose-optimization program which includes automated exposure control, adjustment of the mA and/or kV according to patient size and/or use of iterative reconstruction technique. COMPARISON:  MRI January 17, 2018. CT head January 16, 2018. FINDINGS: Brain: No evidence of acute large vascular territory infarction, hemorrhage, hydrocephalus, extra-axial collection or mass lesion/mass effect. Vascular: No hyperdense vessel.  Calcific atherosclerosis. Skull: No acute fracture. Sinuses/Orbits: Mostly clear sinuses.  No acute orbital findings. Other: No mastoid effusions. IMPRESSION: No evidence of acute intracranial abnormality. Electronically Signed   By: Feliberto Harts M.D.   On: 09/27/2023 00:15     PROCEDURES:  Critical Care performed: No     .1-3 Lead EKG Interpretation  Performed by: Sharronda Schweers, Layla Maw, DO Authorized by: Allianna Beaubien, Layla Maw, DO     Interpretation: normal     ECG rate:  82   ECG rate assessment: normal     Rhythm: sinus rhythm     Ectopy: none     Conduction: normal       IMPRESSION / MDM / ASSESSMENT AND PLAN / ED COURSE  I reviewed the triage vital signs and the nursing notes.    Patient here with paresthesias.   No weakness but has diminished reflexes on exam.  The patient is on the cardiac monitor to evaluate for evidence of arrhythmia and/or significant heart rate changes.   DIFFERENTIAL DIAGNOSIS (includes but not limited to):   Medication side effect, electrolyte derangement, thyroid dysfunction, Guillain-Barr, MS, stroke, vitamin deficiency, peripheral neuropathy   Patient's presentation is most consistent with acute presentation with potential threat to life or bodily function.   PLAN: Will obtain labs, urine, CT head.  Will consult neurology and discussed with poison control.  It appears the supplement that he took contains mostly herbal supplements but FDA has warned recently that the supplements often contain phosphodiesterase inhibitors  such as sildenafil or tadalafil.  Will discuss with poison control.  Will consult neurology as well.   MEDICATIONS GIVEN IN ED: Medications - No data to display   ED COURSE: CT head reviewed and interpreted by myself and the radiologist.  Labs show normal hemoglobin, electrolytes, thyroid function.    Discussed with patient that symptoms seemed atypical for a phosphodiesterase inhibitor and have recommended further workup including MRI of the brain and cervical spine and also potential lumbar puncture to rule out Guillain-Barr given stocking and glove like tingling symptoms with diminished reflexes.  Patient initially agrees to MRI, neurology evaluation but then I was called back to the room as patient states he is now feeling much better and although symptoms have not resolved, he would like discharge home.  He does not want to stay for neurology consultation, further imaging, lumbar puncture.  Discussed with patient and significant other that we cannot rule out life-threatening conditions such as Guillain-Barr, MS, stroke without further workup today and although symptoms may be caused by the over-the-counter medication that he took, paresthesias seem to  be a very atypical symptom for herbal medications and phosphodiesterase inhibitors.  Both patient and significant other verbalized understanding.  We discussed that without further workup we cannot rule out severe, worsening symptoms, severe permanent disability or even death.  The verbalized understanding and patient would still like to leave AGAINST MEDICAL ADVICE.  He appears to have capacity to make decisions for himself and is not intoxicated.  Discussed with the patient if it anytime he changes his mind or his symptoms worsen or he develops any additional symptoms that he should return to the emergency department immediately.  I did recommend that they avoid over-the-counter medications in the future and I did place a referral for a primary care doctor given patient reports he does not have one.  CONSULTS:  Spoke with Caryn Bee with poison control.  He states that these medications are not regulated and it is unclear often what is in them.  He states that paresthesias would be an atypical side effect from a phosphodiesterase inhibitor but it is not outside the room with possibility that this could be causing symptoms today.  Given no hypotension, no further treatment needed.  He does state that some of these phosphodiesterase inhibitors can last for days so patient could persistently have symptoms.   OUTSIDE RECORDS REVIEWED: No prior records for review.       FINAL CLINICAL IMPRESSION(S) / ED DIAGNOSES   Final diagnoses:  Paresthesias     Rx / DC Orders   ED Discharge Orders          Ordered    Ambulatory Referral to Primary Care (Establish Care)        09/27/23 0132             Note:  This document was prepared using Dragon voice recognition software and may include unintentional dictation errors.   Charlisa Cham, Layla Maw, DO 09/27/23 [phone]

## 2023-09-27 ENCOUNTER — Other Ambulatory Visit: Payer: Self-pay

## 2023-09-27 ENCOUNTER — Emergency Department: Payer: Self-pay

## 2023-09-27 LAB — CBC WITH DIFFERENTIAL/PLATELET
Abs Immature Granulocytes: 0.01 10*3/uL (ref 0.00–0.07)
Basophils Absolute: 0.1 10*3/uL (ref 0.0–0.1)
Basophils Relative: 1 %
Eosinophils Absolute: 0.6 10*3/uL — ABNORMAL HIGH (ref 0.0–0.5)
Eosinophils Relative: 7 %
HCT: 43.2 % (ref 39.0–52.0)
Hemoglobin: 14.2 g/dL (ref 13.0–17.0)
Immature Granulocytes: 0 %
Lymphocytes Relative: 32 %
Lymphs Abs: 2.7 10*3/uL (ref 0.7–4.0)
MCH: 32.7 pg (ref 26.0–34.0)
MCHC: 32.9 g/dL (ref 30.0–36.0)
MCV: 99.5 fL (ref 80.0–100.0)
Monocytes Absolute: 0.8 10*3/uL (ref 0.1–1.0)
Monocytes Relative: 9 %
Neutro Abs: 4.5 10*3/uL (ref 1.7–7.7)
Neutrophils Relative %: 51 %
Platelets: 280 10*3/uL (ref 150–400)
RBC: 4.34 MIL/uL (ref 4.22–5.81)
RDW: 13.1 % (ref 11.5–15.5)
WBC: 8.7 10*3/uL (ref 4.0–10.5)
nRBC: 0 % (ref 0.0–0.2)

## 2023-09-27 LAB — COMPREHENSIVE METABOLIC PANEL
ALT: 11 U/L (ref 0–44)
AST: 14 U/L — ABNORMAL LOW (ref 15–41)
Albumin: 3.9 g/dL (ref 3.5–5.0)
Alkaline Phosphatase: 56 U/L (ref 38–126)
Anion gap: 10 (ref 5–15)
BUN: 19 mg/dL (ref 6–20)
CO2: 26 mmol/L (ref 22–32)
Calcium: 9 mg/dL (ref 8.9–10.3)
Chloride: 103 mmol/L (ref 98–111)
Creatinine, Ser: 0.93 mg/dL (ref 0.61–1.24)
GFR, Estimated: >60 mL/min (ref >60)
Glucose, Bld: 88 mg/dL (ref 70–99)
Potassium: 4 mmol/L (ref 3.5–5.1)
Sodium: 139 mmol/L (ref 135–145)
Total Bilirubin: 0.5 mg/dL (ref 0.0–1.2)
Total Protein: 6.4 g/dL — ABNORMAL LOW (ref 6.5–8.1)

## 2023-09-27 LAB — TSH: TSH: 2.523 u[IU]/mL (ref 0.350–4.500)

## 2023-09-27 LAB — VITAMIN B12: Vitamin B-12: 270 pg/mL (ref 180–914)

## 2023-09-27 LAB — MAGNESIUM: Magnesium: 2.3 mg/dL (ref 1.7–2.4)

## 2023-09-27 LAB — HIV ANTIBODY (ROUTINE TESTING W REFLEX): HIV Screen 4th Generation wRfx: NONREACTIVE

## 2023-09-27 NOTE — ED Notes (Signed)
 Patient leaving AMA after refusing to have MRI, MD aware and discharge instructions given. Signature pad not working for [employer], this RN and Glo Herring, heard patient verbalize he was leaving AMA.

## 2023-09-27 NOTE — Discharge Instructions (Addendum)
 We have recommended further workup in the emergency department including MRIs of your brain and cervical spine and possible lumbar puncture to rule out conditions such as multiple sclerosis, stroke, brain mass, Guillain-Barr which can be life-threatening to you.  You have decided to leave AGAINST MEDICAL ADVICE without further workup.  We have discussed without further workup that cannot rule out life-threatening illness that could lead to worsening symptoms, severe and permanent disability and even death.  Your head CT today was normal.  The rest of your blood work is still pending.  You may follow-up on these results through MyChart.  If you develop any worsening symptoms including worsening numbness, tingling, weakness of your extremities, unable to hold your bowel or bladder, unable to ambulate, difficulty breathing, vision or speech changes, I recommend you return to the emergency department immediately.  I did place referral for a primary care physician.  If it anytime you change your mind and want further workup, please return to the emergency department as we are more than happy to take care of you.

## 2023-09-27 NOTE — ED Notes (Signed)
 Patient refused discharge vital signs.

## 2024-04-07 ENCOUNTER — Other Ambulatory Visit: Payer: Self-pay

## 2024-04-07 ENCOUNTER — Emergency Department
Admission: EM | Admit: 2024-04-07 | Discharge: 2024-04-07 | Disposition: A | Discharge status: Home / Self Care | Payer: Self-pay | Attending: Emergency Medicine | Admitting: Emergency Medicine

## 2024-04-07 ENCOUNTER — Emergency Department: Payer: Self-pay

## 2024-04-07 ENCOUNTER — Encounter: Payer: Self-pay | Admitting: Emergency Medicine

## 2024-04-07 DIAGNOSIS — S0502XA Injury of conjunctiva and corneal abrasion without foreign body, left eye, initial encounter: Secondary | ICD-10-CM | POA: Insufficient documentation

## 2024-04-07 DIAGNOSIS — X58XXXA Exposure to other specified factors, initial encounter: Secondary | ICD-10-CM | POA: Insufficient documentation

## 2024-04-07 MED ORDER — FLUORESCEIN SODIUM 1 MG OP STRP
1.0000 | ORAL_STRIP | Freq: Once | OPHTHALMIC | Status: AC
  Administered 2024-04-07: 1 via OPHTHALMIC
  Filled 2024-04-07: qty 1

## 2024-04-07 MED ORDER — ERYTHROMYCIN 5 MG/GM OP OINT
1.0000 | TOPICAL_OINTMENT | Freq: Two times a day (BID) | OPHTHALMIC | 0 refills | Status: AC
Start: 2024-04-07 — End: ?

## 2024-04-07 NOTE — ED Triage Notes (Signed)
 Patient to ED via POV for left eye injury. Pt reports a limb stabbed him in the left eye. Pt reports he had to pull the limb out and reports it was bleeding. Eye red. PT states blurred vision in that eye.

## 2024-04-07 NOTE — ED Provider Notes (Signed)
 [facility] Provider Note    Event Date/Time   First MD Initiated Contact with Patient 04/07/24 1833     (approximate)   History   Eye Injury   HPI  Antonio Figueroa is a 48 y.o. male who presents with left eye injury.  Patient reports a limb poked him in the left eye, this occurred just prior to arrival he complains of blurred vision in the left eye.  No other injury     Physical Exam   Triage Vital Signs: ED Triage Vitals  Encounter Vitals Group     BP 04/07/24 1819 125/87     Girls Systolic BP Percentile --      Girls Diastolic BP Percentile --      Boys Systolic BP Percentile --      Boys Diastolic BP Percentile --      Pulse Rate 04/07/24 1819 86     Resp 04/07/24 1819 17     Temp 04/07/24 1819 98.1 F (36.7 C)     Temp Source 04/07/24 1819 Oral     SpO2 04/07/24 1819 97 %     Weight 04/07/24 1818 77.1 kg (170 lb)     Height 04/07/24 1818 1.93 m (6' 4)     Head Circumference --      Peak Flow --      Pain Score 04/07/24 1818 3     Pain Loc --      Pain Education --      Exclude from Growth Chart --     Most recent vital signs: Vitals:   04/07/24 1819  BP: 125/87  Pulse: 86  Resp: 17  Temp: 98.1 F (36.7 C)  SpO2: 97%     General: Awake, no distress.  CV:  Good peripheral perfusion.  Resp:  Normal effort.  Abd:  No distention.  Other:  Left eye: Subconjunctival hemorrhage but shape of eyes normal, no clear laceration   ED Results / Procedures / Treatments   Labs (all labs ordered are listed, but only abnormal results are displayed) Labs Reviewed - No data to display   EKG     RADIOLOGY CT orbits viewed interpreted by me no evidence of globe rupture    PROCEDURES:  Critical Care performed:   Procedures   MEDICATIONS ORDERED IN ED: Medications  fluorescein  ophthalmic strip 1 strip (has no administration in time range)     IMPRESSION / MDM / ASSESSMENT AND PLAN / ED COURSE  I reviewed the triage  vital signs and the nursing notes. Patient's presentation is most consistent with acute presentation with potential threat to life or bodily function.  Patient presents with injury to the left eye, differential includes globe rupture, corneal abrasion  Will send for CT to rule out rupture, eval with fluorescein  afterwards  CT negative for globe rupture, foreign body near nasal bridge not related to injury which occurred to the lateral aspect  Fluorescein  staining demonstrates lateral corneal abrasion, will treat with 3 to mycin ointment, OTC medication, appropriate for discharge with close follow-up with ophthalmology     FINAL CLINICAL IMPRESSION(S) / ED DIAGNOSES   Final diagnoses:  Abrasion of left cornea, initial encounter     Rx / DC Orders   ED Discharge Orders          Ordered    erythromycin  ophthalmic ointment  2 times daily        04/07/24 1936  Note:  This document was prepared using Dragon voice recognition software and may include unintentional dictation errors.   Arlander Charleston, MD 04/07/24 [phone]

## 2024-04-07 NOTE — ED Notes (Signed)
Visual acuity OD 20/20, OS 20/40, OU 20/20
